# Patient Record
Sex: Female | Born: 1975 | Race: Black or African American | Hispanic: No | Marital: Single | Smoking: Never smoker
Health system: Southern US, Community
[De-identification: ages and names within clinical notes are randomized; demographics above are authoritative.]

## PROBLEM LIST (undated history)

## (undated) ENCOUNTER — Inpatient Hospital Stay (HOSPITAL_COMMUNITY): Payer: Self-pay

## (undated) DIAGNOSIS — F32A Depression, unspecified: Secondary | ICD-10-CM

## (undated) DIAGNOSIS — Z9884 Bariatric surgery status: Secondary | ICD-10-CM

## (undated) DIAGNOSIS — F329 Major depressive disorder, single episode, unspecified: Secondary | ICD-10-CM

## (undated) DIAGNOSIS — Z973 Presence of spectacles and contact lenses: Secondary | ICD-10-CM

## (undated) DIAGNOSIS — K219 Gastro-esophageal reflux disease without esophagitis: Secondary | ICD-10-CM

## (undated) DIAGNOSIS — F419 Anxiety disorder, unspecified: Secondary | ICD-10-CM

## (undated) HISTORY — PX: BUNIONECTOMY: SHX129

## (undated) HISTORY — PX: LAPAROSCOPIC CHOLECYSTECTOMY: SUR755

## (undated) HISTORY — PX: LAPAROSCOPIC GASTRIC SLEEVE RESECTION: SHX5895

---

## 2010-02-06 DIAGNOSIS — Z9884 Bariatric surgery status: Secondary | ICD-10-CM

## 2010-02-06 HISTORY — DX: Bariatric surgery status: Z98.84

## 2010-06-09 ENCOUNTER — Ambulatory Visit: Payer: Self-pay | Admitting: Bariatrics

## 2010-06-17 ENCOUNTER — Ambulatory Visit: Payer: Self-pay | Admitting: Bariatrics

## 2010-07-08 ENCOUNTER — Ambulatory Visit: Payer: Self-pay | Admitting: Bariatrics

## 2010-07-26 ENCOUNTER — Emergency Department: Payer: Self-pay | Admitting: Internal Medicine

## 2013-05-15 ENCOUNTER — Encounter (HOSPITAL_BASED_OUTPATIENT_CLINIC_OR_DEPARTMENT_OTHER): Admission: RE | Payer: Self-pay | Source: Ambulatory Visit

## 2013-05-15 ENCOUNTER — Ambulatory Visit (HOSPITAL_BASED_OUTPATIENT_CLINIC_OR_DEPARTMENT_OTHER): Admission: RE | Admit: 2013-05-15 | Payer: Self-pay | Source: Ambulatory Visit | Admitting: Orthopedic Surgery

## 2013-05-15 SURGERY — REMOVAL, HARDWARE
Anesthesia: Monitor Anesthesia Care | Site: Foot | Laterality: Bilateral

## 2013-07-31 ENCOUNTER — Encounter (HOSPITAL_BASED_OUTPATIENT_CLINIC_OR_DEPARTMENT_OTHER): Payer: Self-pay | Admitting: *Deleted

## 2013-08-06 ENCOUNTER — Other Ambulatory Visit: Payer: Self-pay | Admitting: Orthopedic Surgery

## 2013-08-07 ENCOUNTER — Ambulatory Visit (HOSPITAL_BASED_OUTPATIENT_CLINIC_OR_DEPARTMENT_OTHER)
Admission: RE | Admit: 2013-08-07 | Discharge: 2013-08-07 | Disposition: A | Discharge status: Home / Self Care | Payer: BC Managed Care – PPO | Source: Ambulatory Visit | Attending: Orthopedic Surgery | Admitting: Orthopedic Surgery

## 2013-08-07 ENCOUNTER — Encounter (HOSPITAL_BASED_OUTPATIENT_CLINIC_OR_DEPARTMENT_OTHER)
Admission: RE | Disposition: A | Discharge status: Home / Self Care | Payer: Self-pay | Source: Ambulatory Visit | Attending: Orthopedic Surgery

## 2013-08-07 ENCOUNTER — Encounter (HOSPITAL_BASED_OUTPATIENT_CLINIC_OR_DEPARTMENT_OTHER): Payer: Self-pay | Admitting: *Deleted

## 2013-08-07 ENCOUNTER — Encounter (HOSPITAL_BASED_OUTPATIENT_CLINIC_OR_DEPARTMENT_OTHER): Payer: BC Managed Care – PPO | Admitting: Anesthesiology

## 2013-08-07 ENCOUNTER — Ambulatory Visit (HOSPITAL_BASED_OUTPATIENT_CLINIC_OR_DEPARTMENT_OTHER): Payer: BC Managed Care – PPO | Admitting: Anesthesiology

## 2013-08-07 DIAGNOSIS — M79609 Pain in unspecified limb: Secondary | ICD-10-CM | POA: Insufficient documentation

## 2013-08-07 DIAGNOSIS — T8484XD Pain due to internal orthopedic prosthetic devices, implants and grafts, subsequent encounter: Secondary | ICD-10-CM

## 2013-08-07 DIAGNOSIS — T8489XA Other specified complication of internal orthopedic prosthetic devices, implants and grafts, initial encounter: Secondary | ICD-10-CM | POA: Insufficient documentation

## 2013-08-07 DIAGNOSIS — Y838 Other surgical procedures as the cause of abnormal reaction of the patient, or of later complication, without mention of misadventure at the time of the procedure: Secondary | ICD-10-CM | POA: Insufficient documentation

## 2013-08-07 DIAGNOSIS — F411 Generalized anxiety disorder: Secondary | ICD-10-CM | POA: Insufficient documentation

## 2013-08-07 DIAGNOSIS — Z79899 Other long term (current) drug therapy: Secondary | ICD-10-CM | POA: Insufficient documentation

## 2013-08-07 DIAGNOSIS — K219 Gastro-esophageal reflux disease without esophagitis: Secondary | ICD-10-CM | POA: Insufficient documentation

## 2013-08-07 HISTORY — PX: HARDWARE REMOVAL: SHX979

## 2013-08-07 HISTORY — DX: Anxiety disorder, unspecified: F41.9

## 2013-08-07 HISTORY — DX: Gastro-esophageal reflux disease without esophagitis: K21.9

## 2013-08-07 LAB — POCT HEMOGLOBIN-HEMACUE: Hemoglobin: 11.6 g/dL — ABNORMAL LOW (ref 12.0–15.0)

## 2013-08-07 SURGERY — REMOVAL, HARDWARE
Anesthesia: General | Site: Foot | Laterality: Bilateral

## 2013-08-07 MED ORDER — MIDAZOLAM HCL 2 MG/2ML IJ SOLN
INTRAMUSCULAR | Status: AC
  Filled 2013-08-07: qty 2

## 2013-08-07 MED ORDER — OXYCODONE HCL 5 MG PO TABS: 5.0000 mg | ORAL_TABLET | Freq: Once | ORAL | Status: DC | PRN

## 2013-08-07 MED ORDER — 0.9 % SODIUM CHLORIDE (POUR BTL) OPTIME
TOPICAL | Status: DC | PRN
  Administered 2013-08-07: 250 mL

## 2013-08-07 MED ORDER — FENTANYL CITRATE 0.05 MG/ML IJ SOLN
INTRAMUSCULAR | Status: AC
  Filled 2013-08-07: qty 4

## 2013-08-07 MED ORDER — ACETAMINOPHEN 500 MG PO TABS
1000.0000 mg | ORAL_TABLET | Freq: Once | ORAL | Status: AC
  Administered 2013-08-07: 1000 mg via ORAL

## 2013-08-07 MED ORDER — BUPIVACAINE-EPINEPHRINE (PF) 0.5% -1:200000 IJ SOLN
INTRAMUSCULAR | Status: AC
  Filled 2013-08-07: qty 30

## 2013-08-07 MED ORDER — EPHEDRINE SULFATE 50 MG/ML IJ SOLN
INTRAMUSCULAR | Status: DC | PRN
  Administered 2013-08-07: 10 mg via INTRAVENOUS

## 2013-08-07 MED ORDER — CHLORHEXIDINE GLUCONATE 4 % EX LIQD: 60.0000 mL | Freq: Once | CUTANEOUS | Status: DC

## 2013-08-07 MED ORDER — SODIUM CHLORIDE 0.9 % IV SOLN: INTRAVENOUS | Status: DC

## 2013-08-07 MED ORDER — CEFAZOLIN SODIUM-DEXTROSE 2-3 GM-% IV SOLR
2.0000 g | INTRAVENOUS | Status: AC
  Administered 2013-08-07: 2 g via INTRAVENOUS

## 2013-08-07 MED ORDER — BACITRACIN ZINC 500 UNIT/GM EX OINT
TOPICAL_OINTMENT | CUTANEOUS | Status: AC
  Filled 2013-08-07: qty 28.35

## 2013-08-07 MED ORDER — MIDAZOLAM HCL 5 MG/5ML IJ SOLN
INTRAMUSCULAR | Status: DC | PRN
  Administered 2013-08-07: 2 mg via INTRAVENOUS

## 2013-08-07 MED ORDER — FENTANYL CITRATE 0.05 MG/ML IJ SOLN: 50.0000 ug | INTRAMUSCULAR | Status: DC | PRN

## 2013-08-07 MED ORDER — BUPIVACAINE-EPINEPHRINE 0.5% -1:200000 IJ SOLN
INTRAMUSCULAR | Status: DC | PRN
  Administered 2013-08-07: 10 mL

## 2013-08-07 MED ORDER — DEXAMETHASONE SODIUM PHOSPHATE 4 MG/ML IJ SOLN
INTRAMUSCULAR | Status: DC | PRN
  Administered 2013-08-07: 10 mg via INTRAVENOUS

## 2013-08-07 MED ORDER — CEFAZOLIN SODIUM-DEXTROSE 2-3 GM-% IV SOLR
INTRAVENOUS | Status: AC
  Filled 2013-08-07: qty 50

## 2013-08-07 MED ORDER — PROPOFOL 10 MG/ML IV BOLUS
INTRAVENOUS | Status: DC | PRN
  Administered 2013-08-07: 300 mg via INTRAVENOUS

## 2013-08-07 MED ORDER — ONDANSETRON HCL 4 MG/2ML IJ SOLN: 4.0000 mg | Freq: Once | INTRAMUSCULAR | Status: DC | PRN

## 2013-08-07 MED ORDER — ONDANSETRON HCL 4 MG/2ML IJ SOLN
INTRAMUSCULAR | Status: DC | PRN
  Administered 2013-08-07: 4 mg via INTRAVENOUS

## 2013-08-07 MED ORDER — MIDAZOLAM HCL 2 MG/2ML IJ SOLN: 1.0000 mg | INTRAMUSCULAR | Status: DC | PRN

## 2013-08-07 MED ORDER — FENTANYL CITRATE 0.05 MG/ML IJ SOLN
INTRAMUSCULAR | Status: DC | PRN
  Administered 2013-08-07: 100 ug via INTRAVENOUS

## 2013-08-07 MED ORDER — OXYCODONE HCL 5 MG/5ML PO SOLN: 5.0000 mg | Freq: Once | ORAL | Status: DC | PRN

## 2013-08-07 MED ORDER — LIDOCAINE HCL (CARDIAC) 20 MG/ML IV SOLN
INTRAVENOUS | Status: DC | PRN
  Administered 2013-08-07: 75 mg via INTRAVENOUS

## 2013-08-07 MED ORDER — BUPIVACAINE HCL (PF) 0.25 % IJ SOLN
INTRAMUSCULAR | Status: AC
  Filled 2013-08-07: qty 30

## 2013-08-07 MED ORDER — ACETAMINOPHEN 500 MG PO TABS
ORAL_TABLET | ORAL | Status: AC
  Filled 2013-08-07: qty 2

## 2013-08-07 MED ORDER — BUPIVACAINE-EPINEPHRINE (PF) 0.25% -1:200000 IJ SOLN
INTRAMUSCULAR | Status: AC
  Filled 2013-08-07: qty 30

## 2013-08-07 MED ORDER — HYDROMORPHONE HCL PF 1 MG/ML IJ SOLN
INTRAMUSCULAR | Status: AC
  Filled 2013-08-07: qty 1

## 2013-08-07 MED ORDER — TRAMADOL HCL 50 MG PO TABS
50.0000 mg | ORAL_TABLET | Freq: Four times a day (QID) | ORAL | Status: DC | PRN
Start: 2013-08-07 — End: 2014-02-27

## 2013-08-07 MED ORDER — LACTATED RINGERS IV SOLN
INTRAVENOUS | Status: DC
  Administered 2013-08-07 (×2): via INTRAVENOUS

## 2013-08-07 MED ORDER — HYDROMORPHONE HCL PF 1 MG/ML IJ SOLN
0.2500 mg | INTRAMUSCULAR | Status: DC | PRN
  Administered 2013-08-07: 0.5 mg via INTRAVENOUS

## 2013-08-07 SURGICAL SUPPLY — 71 items
BAG DECANTER FOR FLEXI CONT (MISCELLANEOUS) IMPLANT
BANDAGE ELASTIC 4 VELCRO ST LF (GAUZE/BANDAGES/DRESSINGS) ×6 IMPLANT
BANDAGE ESMARK 6X9 LF (GAUZE/BANDAGES/DRESSINGS) IMPLANT
BLADE SURG 15 STRL LF DISP TIS (BLADE) ×2 IMPLANT
BLADE SURG 15 STRL SS (BLADE) ×4
BNDG COHESIVE 4X5 TAN STRL (GAUZE/BANDAGES/DRESSINGS) IMPLANT
BNDG COHESIVE 6X5 TAN STRL LF (GAUZE/BANDAGES/DRESSINGS) IMPLANT
BNDG ESMARK 4X9 LF (GAUZE/BANDAGES/DRESSINGS) ×3 IMPLANT
BNDG ESMARK 6X9 LF (GAUZE/BANDAGES/DRESSINGS)
CHLORAPREP W/TINT 26ML (MISCELLANEOUS) ×6 IMPLANT
CLOSURE WOUND 1/2 X4 (GAUZE/BANDAGES/DRESSINGS) ×1
COVER TABLE BACK 60X90 (DRAPES) ×3 IMPLANT
CUFF TOURNIQUET SINGLE 34IN LL (TOURNIQUET CUFF) IMPLANT
DECANTER SPIKE VIAL GLASS SM (MISCELLANEOUS) IMPLANT
DRAPE EXTREMITY BILATERAL (DRAPE) ×3 IMPLANT
DRAPE EXTREMITY T 121X128X90 (DRAPE) IMPLANT
DRAPE OEC MINIVIEW 54X84 (DRAPES) ×3 IMPLANT
DRAPE SURG 17X23 STRL (DRAPES) ×6 IMPLANT
DRAPE U-SHAPE 47X51 STRL (DRAPES) IMPLANT
DRSG EMULSION OIL 3X3 NADH (GAUZE/BANDAGES/DRESSINGS) ×3 IMPLANT
DRSG PAD ABDOMINAL 8X10 ST (GAUZE/BANDAGES/DRESSINGS) ×6 IMPLANT
DRSG TEGADERM 4X4.75 (GAUZE/BANDAGES/DRESSINGS) ×6 IMPLANT
ELECT REM PT RETURN 9FT ADLT (ELECTROSURGICAL) ×3
ELECTRODE REM PT RTRN 9FT ADLT (ELECTROSURGICAL) ×1 IMPLANT
GAUZE SPONGE 4X4 12PLY STRL (GAUZE/BANDAGES/DRESSINGS) ×3 IMPLANT
GLOVE BIO SURGEON STRL SZ7 (GLOVE) ×3 IMPLANT
GLOVE BIO SURGEON STRL SZ7.5 (GLOVE) ×3 IMPLANT
GLOVE BIO SURGEON STRL SZ8 (GLOVE) ×3 IMPLANT
GLOVE BIOGEL PI IND STRL 7.0 (GLOVE) ×1 IMPLANT
GLOVE BIOGEL PI IND STRL 7.5 (GLOVE) ×1 IMPLANT
GLOVE BIOGEL PI IND STRL 8 (GLOVE) ×1 IMPLANT
GLOVE BIOGEL PI INDICATOR 7.0 (GLOVE) ×2
GLOVE BIOGEL PI INDICATOR 7.5 (GLOVE) ×2
GLOVE BIOGEL PI INDICATOR 8 (GLOVE) ×2
GLOVE EXAM NITRILE MD LF STRL (GLOVE) ×3 IMPLANT
GOWN STRL REUS W/ TWL LRG LVL3 (GOWN DISPOSABLE) ×2 IMPLANT
GOWN STRL REUS W/ TWL XL LVL3 (GOWN DISPOSABLE) ×1 IMPLANT
GOWN STRL REUS W/TWL LRG LVL3 (GOWN DISPOSABLE) ×4
GOWN STRL REUS W/TWL XL LVL3 (GOWN DISPOSABLE) ×2
NEEDLE HYPO 22GX1.5 SAFETY (NEEDLE) ×3 IMPLANT
PACK BASIN DAY SURGERY FS (CUSTOM PROCEDURE TRAY) ×3 IMPLANT
PAD CAST 4YDX4 CTTN HI CHSV (CAST SUPPLIES) ×2 IMPLANT
PADDING CAST ABS 4INX4YD NS (CAST SUPPLIES)
PADDING CAST ABS COTTON 4X4 ST (CAST SUPPLIES) IMPLANT
PADDING CAST COTTON 4X4 STRL (CAST SUPPLIES) ×4
PADDING CAST COTTON 6X4 STRL (CAST SUPPLIES) IMPLANT
PENCIL BUTTON HOLSTER BLD 10FT (ELECTRODE) IMPLANT
SANITIZER HAND PURELL 535ML FO (MISCELLANEOUS) ×3 IMPLANT
SHEET MEDIUM DRAPE 40X70 STRL (DRAPES) ×3 IMPLANT
SLEEVE SCD COMPRESS KNEE MED (MISCELLANEOUS) IMPLANT
SPLINT FAST PLASTER 5X30 (CAST SUPPLIES)
SPLINT PLASTER CAST FAST 5X30 (CAST SUPPLIES) IMPLANT
SPONGE LAP 18X18 X RAY DECT (DISPOSABLE) ×3 IMPLANT
STAPLER VISISTAT 35W (STAPLE) IMPLANT
STOCKINETTE 6  STRL (DRAPES) ×2
STOCKINETTE 6 STRL (DRAPES) ×1 IMPLANT
STRIP CLOSURE SKIN 1/2X4 (GAUZE/BANDAGES/DRESSINGS) ×2 IMPLANT
SUCTION FRAZIER TIP 10 FR DISP (SUCTIONS) IMPLANT
SUT ETHILON 3 0 PS 1 (SUTURE) IMPLANT
SUT MNCRL AB 3-0 PS2 18 (SUTURE) ×3 IMPLANT
SUT VIC AB 0 SH 27 (SUTURE) IMPLANT
SUT VIC AB 2-0 SH 27 (SUTURE)
SUT VIC AB 2-0 SH 27XBRD (SUTURE) IMPLANT
SUT VICRYL 4-0 PS2 18IN ABS (SUTURE) IMPLANT
SYR BULB 3OZ (MISCELLANEOUS) ×3 IMPLANT
SYRINGE CONTROL L 12CC (SYRINGE) ×3 IMPLANT
TOWEL OR 17X24 6PK STRL BLUE (TOWEL DISPOSABLE) ×6 IMPLANT
TOWEL OR NON WOVEN STRL DISP B (DISPOSABLE) ×3 IMPLANT
TUBE CONNECTING 20'X1/4 (TUBING)
TUBE CONNECTING 20X1/4 (TUBING) IMPLANT
UNDERPAD 30X30 INCONTINENT (UNDERPADS AND DIAPERS) ×3 IMPLANT

## 2013-08-07 NOTE — Brief Op Note (Signed)
08/07/2013  9:34 AM  PATIENT:  [name]  38 y.o. female  PRE-OPERATIVE DIAGNOSIS:  Bilateral painful hardware in feet  POST-OPERATIVE DIAGNOSIS:  Bilateral painful hardware in feet  Procedure(s): BILATERAL FOREFOOT REMOVAL OF DEEP IMPLANTS   SURGEON:  Toni ArthursJohn Marleni Gallardo, MD  ASSISTANT: n/a  ANESTHESIA:   General  EBL:  minimal   TOURNIQUET:   Total Tourniquet Time Documented: Leg (Left) - 9 minutes Total: Leg (Left) - 9 minutes  Leg (Right) - 19 minutes Total: Leg (Right) - 19 minutes   COMPLICATIONS:  None apparent  DISPOSITION:  Extubated, awake and stable to recovery.  DICTATION ID:  [id number]

## 2013-08-07 NOTE — H&P (Signed)
[name] is an 38 y.o. female.   Chief Complaint: bilat foot pain HPI: 38 y/o female without significant pmh c/o bilat foot pain due to hardware from previous bunion corrections.  Past Medical History  Diagnosis Date  . Anxiety   . GERD (gastroesophageal reflux disease)     Past Surgical History  Procedure Laterality Date  . Bunionectomy bilateral    . Vertical sleeve gastrectomy    . Cesarean section      History reviewed. No pertinent family history. Social History:  reports that she has never smoked. She does not have any smokeless tobacco history on file. She reports that she drinks alcohol. She reports that she uses illicit drugs (Marijuana).  Allergies:  Allergies  Allergen Reactions  . Other     Shellfish    Medications Prior to Admission  Medication Sig Dispense Refill  . cetirizine (ZYRTEC) 10 MG tablet Take 10 mg by mouth daily.      Marland Kitchen. esomeprazole (NEXIUM) 40 MG capsule Take 40 mg by mouth 2 (two) times daily.      Marland Kitchen. FLUoxetine (PROZAC) 10 MG tablet Take 10 mg by mouth daily.      . naproxen (NAPROSYN) 500 MG tablet Take 500 mg by mouth 2 (two) times daily with a meal.      . norgestimate-ethinyl estradiol (ORTHO-CYCLEN,SPRINTEC,PREVIFEM) 0.25-35 MG-MCG tablet Take 1 tablet by mouth daily.        No results found for this or any previous visit (from the past 48 hour(s)). No results found.  ROS  No recent f/c/n/v/wt loss  Blood pressure 121/78, pulse 59, temperature 98.7 F (37.1 C), temperature source Oral, resp. rate 18, height 5\' 8"  (1.727 m), weight 86.183 kg (190 lb), last menstrual period 08/06/2013, SpO2 100.00%. Physical Exam  wn wd woman in nad.  A and o x 4.  Mood and affec tnormal.  EOmi.  Resp unlabored.  Bilat feet with healed incisions.  No signs of infection.  No lymphadenopathy.  TTP dorsally at 1st MT necks.  Sens to LT is normal.  5/5 strength in PF and DF of the toes.  Assessment/Plan Painful hardware bilat feet - to OR for  removal of deep implants from both feet.  The risks and benefits of the alternative treatment options have been discussed in detail.  The patient wishes to proceed with surgery and specifically understands risks of bleeding, infection, nerve damage, blood clots, need for additional surgery, amputation and death.   [name], [name] 08/07/2013, 8:13 AM

## 2013-08-07 NOTE — Transfer of Care (Signed)
Immediate Anesthesia Transfer of Care Note  Patient: [name]  Procedure(s) Performed: Procedure(s): BILATERAL FOREFOOT REMOVAL OF DEEP IMPLANTS  (Bilateral)  Patient Location: PACU  Anesthesia Type:General  Level of Consciousness: awake and patient cooperative  Airway & Oxygen Therapy: Patient Spontanous Breathing and Patient connected to face mask oxygen  Post-op Assessment: Report given to PACU RN and Post -op Vital signs reviewed and stable  Post vital signs: Reviewed and stable  Complications: No apparent anesthesia complications

## 2013-08-07 NOTE — Anesthesia Postprocedure Evaluation (Signed)
  Anesthesia Post-op Note  Patient: Yvette ChewJamiequay F Konkle  Procedure(s) Performed: Procedure(s): BILATERAL FOREFOOT REMOVAL OF DEEP IMPLANTS  (Bilateral)  Patient Location: PACU  Anesthesia Type:General  Level of Consciousness: awake, alert and oriented  Airway and Oxygen Therapy: spontaneous breathing  Post-op Pain: mild  Post-op Assessment: Post-op Vital signs reviewed  Post-op Vital Signs: Reviewed  Last Vitals:  Filed Vitals:   08/07/13 1015  BP: 121/78  Pulse: 73  Temp:   Resp: 16    Complications: No apparent anesthesia complications

## 2013-08-07 NOTE — Anesthesia Procedure Notes (Signed)
Procedure Name: LMA Insertion Date/Time: 08/07/2013 8:37 AM Performed by: Zenia ResidesPAYNE, Aston Lieske D Pre-anesthesia Checklist: Patient identified, Emergency Drugs available, Suction available and Patient being monitored Patient Re-evaluated:Patient Re-evaluated prior to inductionOxygen Delivery Method: Circle System Utilized Preoxygenation: Pre-oxygenation with 100% oxygen Intubation Type: IV induction Ventilation: Mask ventilation without difficulty LMA: LMA inserted LMA Size: 4.0 Number of attempts: 1 Airway Equipment and Method: bite block Placement Confirmation: positive ETCO2 Tube secured with: Tape Dental Injury: Teeth and Oropharynx as per pre-operative assessment

## 2013-08-07 NOTE — Anesthesia Preprocedure Evaluation (Signed)
Anesthesia Evaluation  Patient identified by MRN, date of birth, ID band Patient awake    Reviewed: Allergy & Precautions, H&P , NPO status , Patient's Chart, lab work & pertinent test results  Airway Mallampati: I  TM Distance: >3 FB Neck ROM: Full    Dental  (+) Teeth Intact, Dental Advisory Given   Pulmonary  breath sounds clear to auscultation        Cardiovascular Rhythm:Regular Rate:Normal     Neuro/Psych    GI/Hepatic GERD-  Medicated and Controlled,  Endo/Other    Renal/GU      Musculoskeletal   Abdominal   Peds  Hematology   Anesthesia Other Findings   Reproductive/Obstetrics                             Anesthesia Physical Anesthesia Plan  ASA: II  Anesthesia Plan: General   Post-op Pain Management:    Induction: Intravenous  Airway Management Planned: LMA  Additional Equipment:   Intra-op Plan:   Post-operative Plan: Extubation in OR  Informed Consent: I have reviewed the patients History and Physical, chart, labs and discussed the procedure including the risks, benefits and alternatives for the proposed anesthesia with the patient or authorized representative who has indicated his/her understanding and acceptance.   Dental advisory given  Plan Discussed with: CRNA, Anesthesiologist and Surgeon  Anesthesia Plan Comments:         Anesthesia Quick Evaluation  

## 2013-08-07 NOTE — Discharge Instructions (Signed)
Toni ArthursJohn Hewitt, MD [facility]  Please read the following information regarding your care after surgery.  Medications  You only need a prescription for the narcotic pain medicine (ex. oxycodone, Percocet, Norco).  All of the other medicines listed below are available over the counter. X acetominophen (Tylenol) 650 mg every 4-6 hours as you need for minor pain X tramadol as prescribed for moderate to severe pain   Narcotic pain medicine (ex. oxycodone, Percocet, Vicodin) will cause constipation.  To prevent this problem, take the following medicines while you are taking any pain medicine. X docusate sodium (Colace) 100 mg twice a day X senna (Senokot) 2 tablets twice a day  Weight Bearing X Bear weight when you are able on your operated leg or foot.  Cast / Splint / Dressing X Remove your dressing 3 days after surgery and cover the incisions with dry dressings.    After your dressing, cast or splint is removed; you may shower, but do not soak or scrub the wound.  Allow the water to run over it, and then gently pat it dry.  Swelling It is normal for you to have swelling where you had surgery.  To reduce swelling and pain, keep your toes above your nose for at least 3 days after surgery.  It may be necessary to keep your foot or leg elevated for several weeks.  If it hurts, it should be elevated.  Follow Up Call my office at [phone] when you are discharged from the hospital or surgery center to schedule an appointment to be seen two weeks after surgery.  If your incisions look OK and you have no concerns about the surgery, you can cancel your follow up appointment.  Call my office at [phone] if you develop a fever >101.5 F, nausea, vomiting, bleeding from the surgical site or severe pain.     Post Anesthesia Home Care Instructions  Activity: Get plenty of rest for the remainder of the day. A responsible adult should stay with you for 24 hours following the procedure.   For the next 24 hours, DO NOT: -Drive a car -Advertising copywriterperate machinery -Drink alcoholic beverages -Take any medication unless instructed by your physician -Make any legal decisions or sign important papers.  Meals: Start with liquid foods such as gelatin or soup. Progress to regular foods as tolerated. Avoid greasy, spicy, heavy foods. If nausea and/or vomiting occur, drink only clear liquids until the nausea and/or vomiting subsides. Call your physician if vomiting continues.  Special Instructions/Symptoms: Your throat may feel dry or sore from the anesthesia or the breathing tube placed in your throat during surgery. If this causes discomfort, gargle with warm salt water. The discomfort should disappear within 24 hours.

## 2013-08-07 NOTE — Op Note (Signed)
NAMEdwena Felty:  [place],                    ACCOUNT NO.:  [id number]  MEDICAL RECORD NO.:  [id number]  LOCATION:                                 FACILITY:  PHYSICIAN:  Toni ArthursJohn Chanci Ojala, MD             DATE OF BIRTH:  DATE OF PROCEDURE:  08/07/2013 DATE OF DISCHARGE:                              OPERATIVE REPORT   PREOPERATIVE DIAGNOSIS:  Painful hardware, bilateral feet.  POSTOPERATIVE DIAGNOSIS:  Painful hardware, bilateral feet.  PROCEDURE:  Removal of deep implants from the forefoot bilaterally.  SURGEON:  Toni ArthursJohn Yates Weisgerber, MD.  ANESTHESIA:  General.  ESTIMATED BLOOD LOSS:  Minimal.  TOURNIQUET TIME:  On the right was 19 minutes with an ankle Esmarch, on the left it was 9 minutes with an ankle Esmarch.  COMPLICATIONS:  None apparent.  DISPOSITION:  Extubated, awake, and stable to recovery.  INDICATIONS FOR PROCEDURE:  The patient is a [age] year old woman, who underwent bilateral bunion correction in the remote past.  She complains of pain where the screw heads are prominent at the dorsal aspect of the first metatarsal neck.  She has failed nonoperative treatment to date and presents now for elective removal of deep implants.  She understands the risks and benefits, the alternative treatment options, and elects surgical treatment.  She specifically understands risks of bleeding, infection, nerve damage, blood clots, need for additional surgery, continued pain, amputation, and death.  PROCEDURE IN DETAIL:  After preoperative consent was obtained and the correct operative sites were identified, the patient was brought to the operating room and placed supine on the operating table.  General anesthesia was induced.  Preoperative antibiotics were administered. Surgical time-out was taken.  Both lower extremities were prepped and draped in standard sterile fashion.  The right foot was exsanguinated and a 4-inch Esmarch tourniquet was wrapped around the ankle.  The patient's screw head was  palpated.  An incision was made over this location.  Sharp dissection was carried down through the skin and subcutaneous tissue.  Screw head was identified.  It was partially overgrown with bone.  This was removed with a curette.  Screwdriver was then used to back up the screw until the screw head stripped.  At this point, the trephine was used to remove the remaining bone from around the screw head and easy-out was then inserted into the screw head.  This removed the screw in its entirety without further difficulty.  Wound was irrigated copiously.  Inverted simple sutures of 3-0 Monocryl were used to close the skin with a subcuticular level.  Steri-Strips were applied followed by sterile dressings and a compression wrap.  Prior to applying the dressings, 6 mL of 0.5% Marcaine with epinephrine were infiltrated into the subcutaneous tissues about the operative site for postoperative pain control.  The tourniquet was released at 19 minutes after application of the dressings.  Attention was then turned to the left lower extremity.  The left foot was exsanguinated and a 4-inch Esmarch tourniquet was wrapped around the ankle.  The patient's screw head was palpated and the incision was made over the screw head.  The screw head was cleared  of all soft tissues with the curette.  Again, significant bony overgrowth was noted.  A trephine was used to remove this bone clearing the head of the screw completely.  The screwdriver was then used to remove the screw in its entirety.  Wound was irrigated copiously and closed with Monocryl and Steri-Strips.  4 mL of 0.5% bupivacaine with epinephrine was infiltrated into the subcutaneous tissues around the operative site.  Sterile dressings were applied followed by compression wrap.  Tourniquet was released at 9 minutes after application of the dressings.  The patient was awakened from anesthesia and transported to the recovery room in stable  condition.  FOLLOWUP PLAN:  The patient will be weightbearing as tolerated on both lower extremities.  She will follow up with me in approximately 2 weeks if she has any concerns about her wounds.  She will gradually resume her regular activities, but avoid jumping or running for a month.     Toni ArthursJohn Gabriele Zwilling, MD     JH/MEDQ  D:  08/07/2013  T:  08/07/2013  Job:  [id number]

## 2013-08-11 ENCOUNTER — Encounter (HOSPITAL_BASED_OUTPATIENT_CLINIC_OR_DEPARTMENT_OTHER): Payer: Self-pay | Admitting: Orthopedic Surgery

## 2014-02-27 ENCOUNTER — Encounter (HOSPITAL_COMMUNITY): Payer: Self-pay | Admitting: *Deleted

## 2014-02-27 ENCOUNTER — Inpatient Hospital Stay (HOSPITAL_COMMUNITY): Payer: BC Managed Care – PPO

## 2014-02-27 ENCOUNTER — Inpatient Hospital Stay (HOSPITAL_COMMUNITY)
Admission: AD | Admit: 2014-02-27 | Discharge: 2014-02-27 | Disposition: A | Discharge status: Home / Self Care | Payer: BC Managed Care – PPO | Source: Ambulatory Visit | Attending: Obstetrics and Gynecology | Admitting: Obstetrics and Gynecology

## 2014-02-27 DIAGNOSIS — Z3A01 Less than 8 weeks gestation of pregnancy: Secondary | ICD-10-CM | POA: Insufficient documentation

## 2014-02-27 DIAGNOSIS — O360910 Maternal care for other rhesus isoimmunization, first trimester, not applicable or unspecified: Secondary | ICD-10-CM | POA: Insufficient documentation

## 2014-02-27 DIAGNOSIS — O209 Hemorrhage in early pregnancy, unspecified: Secondary | ICD-10-CM | POA: Diagnosis present

## 2014-02-27 DIAGNOSIS — O200 Threatened abortion: Secondary | ICD-10-CM | POA: Diagnosis not present

## 2014-02-27 LAB — URINALYSIS, ROUTINE W REFLEX MICROSCOPIC
Bilirubin Urine: NEGATIVE
GLUCOSE, UA: NEGATIVE mg/dL
Ketones, ur: NEGATIVE mg/dL
Leukocytes, UA: NEGATIVE
NITRITE: NEGATIVE
Protein, ur: NEGATIVE mg/dL
Specific Gravity, Urine: 1.015 (ref 1.005–1.030)
Urobilinogen, UA: 0.2 mg/dL (ref 0.0–1.0)
pH: 6.5 (ref 5.0–8.0)

## 2014-02-27 LAB — POCT PREGNANCY, URINE: Preg Test, Ur: POSITIVE — AB

## 2014-02-27 LAB — URINE MICROSCOPIC-ADD ON

## 2014-02-27 MED ORDER — TRAMADOL HCL 50 MG PO TABS: 50.0000 mg | ORAL_TABLET | Freq: Four times a day (QID) | ORAL | Status: DC | PRN

## 2014-02-27 NOTE — MAU Note (Signed)
Had U/S in office 4 days ago and confirmed IUP with heartbeat. Patient states she had some brown D/C and was seen in the office 3 days ago. Was given Rhophylac because she is Rh neg. States today around 2PM, she started having red bleeding. Upon arrival, was having small amount of bright red bleeding in toilet. C/O mild cramping in lower abdomen. Took Tylenol, states that it relieved cramping.

## 2014-02-27 NOTE — MAU Provider Note (Signed)
History     CSN: [id number]  Arrival date and time: 02/27/14 1616   First Provider Initiated Contact with Patient 02/27/14 1657      Chief Complaint  Patient presents with  . Vaginal Bleeding   HPI [name] 39 y.o. G2P1001 @ [redacted]w[redacted]d presents to MAU complaining of vaginal bleeding and pressure in the lower abdomen.  The pressure is midline and has improved since the use acetaminophen 2-3 tsp liquid.  The bleeding is bright red, like a light day during her period, and started around 2 pm.  She denies headache, weakness, dysuria, nausea, vomiting.  She does have some fatigue - just doesn't feel like doing anything.   OB History    Gravida Para Term Preterm AB TAB SAB Ectopic Multiple Living   Past Medical History  Diagnosis Date  . Anxiety   . GERD (gastroesophageal reflux disease)     Past Surgical History  Procedure Laterality Date  . Bunionectomy bilateral    . Vertical sleeve gastrectomy    . Cesarean section    . Hardware removal Bilateral 08/07/2013    Procedure: BILATERAL FOREFOOT REMOVAL OF DEEP IMPLANTS ;  Surgeon: Toni Arthurs, MD;  Location: [facility];  Service: Orthopedics;  Laterality: Bilateral;  . Cholecystectomy      History reviewed. No pertinent family history.  History  Substance Use Topics  . Smoking status: Never Smoker   . Smokeless tobacco: Never Used  . Alcohol Use: Yes     Comment: occassionally    Allergies:  Allergies  Allergen Reactions  . Other     Shellfish  . Pumpkin Seed Hives  . Vicodin [Hydrocodone-Acetaminophen] Rash    Prescriptions prior to admission  Medication Sig Dispense Refill Last Dose  . esomeprazole (NEXIUM) 40 MG capsule Take 40 mg by mouth 2 (two) times daily.   08/07/2013 at Unknown time  . traMADol (ULTRAM) 50 MG tablet Take 1 tablet (50 mg total) by mouth every 6 (six) hours as needed for moderate pain. 10 tablet 0     ROS Pertinent ROS in HPI  Physical Exam   Blood  pressure 110/66, pulse 82, temperature 98.3 F (36.8 C), temperature source Oral, resp. rate 18, height  (1.702 m), weight 183 lb (83.008 kg), last menstrual period 01/06/2014.  Physical Exam  Constitutional: She is oriented to person, place, and time. She appears well-developed and well-nourished.  HENT:  Head: Normocephalic and atraumatic.  Eyes: EOM are normal.  Neck: Normal range of motion.  Cardiovascular: Normal rate and regular rhythm.   Respiratory: Effort normal and breath sounds normal. No respiratory distress.  GI: Soft. Bowel sounds are normal. She exhibits no distension. There is tenderness.  Mild suprapubic tenderness  Genitourinary:  Small amt of dark red blood with small clots present in vagina.  No active bleeding.   Slight tenderness over bladder and in left adnexa.  No CMT  Musculoskeletal: Normal range of motion.  Neurological: She is alert and oriented to person, place, and time.  Skin: Skin is warm and dry.  Psychiatric: She has a normal mood and affect.    MAU Course  Procedures  MDM Discussed with Dr. Rana Snare.  He is in agreement to obtain U/S but no further workup necessitated at this time.  He is agreeable to discharge upon results of U/S.  US Ob Comp Less 14 Wks  02/27/2014   CLINICAL  DATA:  Bleeding, pressure and cramping. Clinical gestational age of [redacted] weeks and 3 days.  EXAM: OBSTETRIC <14 WK US AND TRANSVAGINAL OB US  TECHNIQUE: Both transabdominal and transvaginal ultrasound examinations were performed for complete evaluation of the gestation as well as the maternal uterus, adnexal regions, and pelvic cul-de-sac. Transvaginal technique was performed to assess early pregnancy.  COMPARISON:  None.  FINDINGS: Intrauterine gestational sac: Single  Yolk sac:  Yes  Embryo:  Yes  Cardiac Activity: Yes  Heart Rate:  162 bpm  MSD:    mm    w     d  CRL:   1.44  mm   7 w 6 d                  US EDC: 10/10/2014  Maternal uterus/adnexae:  Subchorionic hemorrhage:  Large subchorionic hemorrhage measures 6.8 x 0.5 x 0.5 cm.  Right ovary: Normal  Left ovary: Not visualized.  Other :The gestational sac appears disproportionately large compared with the crown rump length.  Free fluid:  None  IMPRESSION: Single living intrauterine gestation with an estimated gestational age of [redacted] weeks and 6 days.  Large subchorionic hemorrhage.   Electronically Signed   By: Signa Kellaylor  Stroud M.D.   On: 02/27/2014 18:11   [place] Ob Transvaginal  02/27/2014   CLINICAL DATA:  Bleeding, pressure and cramping. Clinical gestational age of [redacted] weeks and 3 days.  EXAM: OBSTETRIC <14 WK US AND TRANSVAGINAL OB US  TECHNIQUE: Both transabdominal and transvaginal ultrasound examinations were performed for complete evaluation of the gestation as well as the maternal uterus, adnexal regions, and pelvic cul-de-sac. Transvaginal technique was performed to assess early pregnancy.  COMPARISON:  None.  FINDINGS: Intrauterine gestational sac: Single  Yolk sac:  Yes  Embryo:  Yes  Cardiac Activity: Yes  Heart Rate:  162 bpm  MSD:    mm    w     d  CRL:   1.44  mm   7 w 6 d                  US EDC: 10/10/2014  Maternal uterus/adnexae:  Subchorionic hemorrhage: Large subchorionic hemorrhage measures 6.8 x 0.5 x 0.5 cm.  Right ovary: Normal  Left ovary: Not visualized.  Other :The gestational sac appears disproportionately large compared with the crown rump length.  Free fluid:  None  IMPRESSION: Single living intrauterine gestation with an estimated gestational age of [redacted] weeks and 6 days.  Large subchorionic hemorrhage.   Electronically Signed   By: Signa Kellaylor  Stroud M.D.   On: 02/27/2014 18:11     Assessment and Plan  A:  1. Threatened abortion   2. Vaginal bleeding in pregnancy, first trimester    P: Discharge to home Miscarriage precautions Pelvic Rest  Ultram PRN pain uncontrolled with OTC Tylenol RTC on Monday for follow up Patient may return to MAU as needed or if her condition were to change or  worsen   Bertram Denvereague Clark, Blessed Cotham E 02/27/2014, 4:59 PM

## 2014-02-27 NOTE — Discharge Instructions (Signed)
Threatened Miscarriage °A threatened miscarriage occurs when you have vaginal bleeding during your first 20 weeks of pregnancy but the pregnancy has not ended. If you have vaginal bleeding during this time, your health care provider will do tests to make sure you are still pregnant. If the tests show you are still pregnant and the developing baby (fetus) inside your womb (uterus) is still growing, your condition is considered a threatened miscarriage. °A threatened miscarriage does not mean your pregnancy will end, but it does increase the risk of losing your pregnancy (complete miscarriage). °CAUSES  °The cause of a threatened miscarriage is usually not known. If you go on to have a complete miscarriage, the most common cause is an abnormal number of chromosomes in the developing baby. Chromosomes are the structures inside cells that hold all your genetic material. °Some causes of vaginal bleeding that do not result in miscarriage include: °· Having sex. °· Having an infection. °· Normal hormone changes of pregnancy. °· Bleeding that occurs when an egg implants in your uterus. °RISK FACTORS °Risk factors for bleeding in early pregnancy include: °· Obesity. °· Smoking. °· Drinking excessive amounts of alcohol or caffeine. °· Recreational drug use. °SIGNS AND SYMPTOMS °· Light vaginal bleeding. °· Mild abdominal pain or cramps. °DIAGNOSIS  °If you have bleeding with or without abdominal pain before 20 weeks of pregnancy, your health care provider will do tests to check whether you are still pregnant. One important test involves using sound waves and a computer (ultrasound) to create images of the inside of your uterus. Other tests include an internal exam of your vagina and uterus (pelvic exam) and measurement of your baby's heart rate.  °You may be diagnosed with a threatened miscarriage if: °· Ultrasound testing shows you are still pregnant. °· Your baby's heart rate is strong. °· A pelvic exam shows that the  opening between your uterus and your vagina (cervix) is closed. °· Your heart rate and blood pressure are stable. °· Blood tests confirm you are still pregnant. °TREATMENT  °No treatments have been shown to prevent a threatened miscarriage from going on to a complete miscarriage. However, the right home care is important.  °HOME CARE INSTRUCTIONS  °· Make sure you keep all your appointments for prenatal care. This is very important. °· Get plenty of rest. °· Do not have sex or use tampons if you have vaginal bleeding. °· Do not douche. °· Do not smoke or use recreational drugs. °· Do not drink alcohol. °· Avoid caffeine. °SEEK MEDICAL CARE IF: °· You have light vaginal bleeding or spotting while pregnant. °· You have abdominal pain or cramping. °· You have a fever. °SEEK IMMEDIATE MEDICAL CARE IF: °· You have heavy vaginal bleeding. °· You have blood clots coming from your vagina. °· You have severe low back pain or abdominal cramps. °· You have fever, chills, and severe abdominal pain. °MAKE SURE YOU: °· Understand these instructions. °· Will watch your condition. °· Will get help right away if you are not doing well or get worse. °Document Released: 01/23/2005 Document Revised: 01/28/2013 Document Reviewed: 11/19/2012 °ExitCare® Patient Information ©2015 [employer]. This information is not intended to replace advice given to you by your health care provider. Make sure you discuss any questions you have with your health care provider. ° °Pelvic Rest °Pelvic rest is sometimes recommended for women when:  °· The placenta is partially or completely covering the opening of the cervix (placenta previa). °· There is bleeding between the   uterine wall and the amniotic sac in the first trimester (subchorionic hemorrhage). °· The cervix begins to open without labor starting (incompetent cervix, cervical insufficiency). °· The labor is too early (preterm labor). °HOME CARE INSTRUCTIONS °· Do not have sexual intercourse,  stimulation, or an orgasm. °· Do not use tampons, douche, or put anything in the vagina. °· Do not lift anything over 10 pounds (4.5 kg). °· Avoid strenuous activity or straining your pelvic muscles. °SEEK MEDICAL CARE IF:  °· You have any vaginal bleeding during pregnancy. Treat this as a potential emergency. °· You have cramping pain felt low in the stomach (stronger than menstrual cramps). °· You notice vaginal discharge (watery, mucus, or bloody). °· You have a low, dull backache. °· There are regular contractions or uterine tightening. °SEEK IMMEDIATE MEDICAL CARE IF: °You have vaginal bleeding and have placenta previa.  °Document Released: 05/20/2010 Document Revised: 04/17/2011 Document Reviewed: 05/20/2010 °ExitCare® Patient Information ©2015 [employer]. This information is not intended to replace advice given to you by your health care provider. Make sure you discuss any questions you have with your health care provider. ° °

## 2014-03-12 LAB — OB RESULTS CONSOLE RUBELLA ANTIBODY, IGM: Rubella: IMMUNE

## 2014-03-12 LAB — OB RESULTS CONSOLE RPR: RPR: NONREACTIVE

## 2014-03-12 LAB — OB RESULTS CONSOLE ANTIBODY SCREEN: Antibody Screen: NEGATIVE

## 2014-03-12 LAB — OB RESULTS CONSOLE HEPATITIS B SURFACE ANTIGEN: HEP B S AG: NEGATIVE

## 2014-03-12 LAB — OB RESULTS CONSOLE HIV ANTIBODY (ROUTINE TESTING): HIV: NONREACTIVE

## 2014-03-12 LAB — OB RESULTS CONSOLE ABO/RH: RH TYPE: NEGATIVE

## 2014-10-05 ENCOUNTER — Encounter (HOSPITAL_COMMUNITY): Payer: Self-pay | Admitting: Anesthesiology

## 2014-10-05 ENCOUNTER — Encounter (INDEPENDENT_AMBULATORY_CARE_PROVIDER_SITE_OTHER): Payer: Self-pay

## 2014-10-05 ENCOUNTER — Encounter (HOSPITAL_COMMUNITY)
Admission: RE | Admit: 2014-10-05 | Discharge: 2014-10-05 | Disposition: A | Discharge status: Home / Self Care | Payer: BC Managed Care – PPO | Source: Ambulatory Visit | Attending: Obstetrics and Gynecology | Admitting: Obstetrics and Gynecology

## 2014-10-05 ENCOUNTER — Encounter (HOSPITAL_COMMUNITY): Payer: Self-pay

## 2014-10-05 HISTORY — DX: Major depressive disorder, single episode, unspecified: F32.9

## 2014-10-05 HISTORY — DX: Depression, unspecified: F32.A

## 2014-10-05 LAB — CBC
HCT: 34.8 % — ABNORMAL LOW (ref 36.0–46.0)
HEMOGLOBIN: 11.6 g/dL — AB (ref 12.0–15.0)
MCH: 28.2 pg (ref 26.0–34.0)
MCHC: 33.3 g/dL (ref 30.0–36.0)
MCV: 84.5 fL (ref 78.0–100.0)
Platelets: 199 10*3/uL (ref 150–400)
RBC: 4.12 MIL/uL (ref 3.87–5.11)
RDW: 17.1 % — ABNORMAL HIGH (ref 11.5–15.5)
WBC: 7.3 10*3/uL (ref 4.0–10.5)

## 2014-10-05 LAB — TYPE AND SCREEN
ABO/RH(D): AB NEG
Antibody Screen: NEGATIVE

## 2014-10-05 LAB — ABO/RH: ABO/RH(D): AB NEG

## 2014-10-05 NOTE — Patient Instructions (Signed)
Your procedure is scheduled on:10/06/14  Enter through the Main Entrance at :6am Pick up desk phone and dial [phone] and inform us of your arrival.  Please call [phone] if you have any problems the morning of surgery.  Remember: Do not eat food or drink liquids after midnight:tonight Water only is ok until:03:30 am Tuesday   You may brush your teeth the morning of surgery.   DO NOT wear jewelry, eye make-up, lipstick,body lotion, or dark fingernail polish.  (Polished toes are ok) You may wear deodorant.  If you are to be admitted after surgery, leave suitcase in car until your room has been assigned. Patients discharged on the day of surgery will not be allowed to drive home. Wear loose fitting, comfortable clothes for your ride home.

## 2014-10-05 NOTE — Anesthesia Preprocedure Evaluation (Addendum)
Anesthesia Evaluation  Patient identified by MRN, date of birth, ID band Patient awake    Reviewed: Allergy & Precautions, NPO status , Patient's Chart, lab work & pertinent test results  Airway Mallampati: II  TM Distance: >3 FB Neck ROM: Full    Dental no notable dental hx. (+) Caps   Pulmonary shortness of breath,  breath sounds clear to auscultation  Pulmonary exam normal       Cardiovascular negative cardio ROS Normal cardiovascular examRhythm:Regular Rate:Normal     Neuro/Psych PSYCHIATRIC DISORDERS Anxiety Depression negative neurological ROS     GI/Hepatic Neg liver ROS, GERD-  Medicated and Controlled,  Endo/Other  Obesity  Renal/GU negative Renal ROS  negative genitourinary   Musculoskeletal negative musculoskeletal ROS (+)   Abdominal   Peds  (+) Delivery details - Hematology negative hematology ROS (+)   Anesthesia Other Findings   Reproductive/Obstetrics (+) Pregnancy Previous C/Section AMA 38 weeks                            Anesthesia Physical Anesthesia Plan  ASA: II  Anesthesia Plan: Spinal   Post-op Pain Management:    Induction:   Airway Management Planned: Natural Airway  Additional Equipment:   Intra-op Plan:   Post-operative Plan:   Informed Consent: I have reviewed the patients History and Physical, chart, labs and discussed the procedure including the risks, benefits and alternatives for the proposed anesthesia with the patient or authorized representative who has indicated his/her understanding and acceptance.   Dental advisory given  Plan Discussed with: CRNA, Anesthesiologist and Surgeon  Anesthesia Plan Comments: (Will not use intrathecal narcotics due to allergy)        Anesthesia Quick Evaluation

## 2014-10-05 NOTE — H&P (Signed)
[name] is a 39 y.o. female presenting at 30 weeks for  Repeat cesarean section.  Prior cesarean section for failure to descend. Declined trial of labor. Maternal Medical History:  Reason for admission: Repeat cesearean section  Fetal activity: Perceived fetal activity is normal.    Prenatal complications: no prenatal complications Prenatal Complications - Diabetes: none.    OB History    Gravida Para Term Preterm AB TAB SAB Ectopic Multiple Living   Past Medical History  Diagnosis Date  . Anxiety   . GERD (gastroesophageal reflux disease)   . Shortness of breath dyspnea 2016    during this pregnancy-cannot lay flat  . Depression    Past Surgical History  Procedure Laterality Date  . Bunionectomy bilateral    . Vertical sleeve gastrectomy    . Cesarean section    . Hardware removal Bilateral 08/07/2013    Procedure: BILATERAL FOREFOOT REMOVAL OF DEEP IMPLANTS ;  Surgeon: Toni Arthurs, MD;  Location: [facility];  Service: Orthopedics;  Laterality: Bilateral;  . Cholecystectomy     Family History: family history is not on file. Social History:  reports that she has never smoked. She has never used smokeless tobacco. She reports that she drinks alcohol. She reports that she uses illicit drugs (Marijuana).   Prenatal Transfer Tool  Maternal Diabetes: No Genetic Screening: Normal Maternal Ultrasounds/Referrals: Normal Fetal Ultrasounds or other Referrals:  None Maternal Substance Abuse:  No Significant Maternal Medications:  None Significant Maternal Lab Results:  Lab values include: Group B Strep positive Other Comments:  None  ROS    Last menstrual period 01/06/2014. Maternal Exam:  Abdomen: Patient reports no abdominal tenderness. Surgical scars: low transverse.   Fetal presentation: vertex     Physical Exam  Constitutional: She is oriented to person, place, and time. She appears well-developed and well-nourished.   HENT:  Head: Normocephalic.  Eyes: Conjunctivae and EOM are normal. Pupils are equal, round, and reactive to light.  Cardiovascular: Normal rate, regular rhythm and normal heart sounds.   Respiratory: Effort normal and breath sounds normal.  GI:  Gravid uterus c/w dates well healed low transverse incision  Musculoskeletal: Normal range of motion.  Neurological: She is alert and oriented to person, place, and time.    Prenatal labs: ABO, Rh: AB/Negative/-- (02/04 0000) Antibody: Negative (02/04 0000) Rubella: Immune (02/04 0000) RPR: Nonreactive (02/04 0000)  HBsAg: Negative (02/04 0000)  HIV: Non-reactive (02/04 0000)  GBS:     Assessment/Plan: IUP AT TERM FOR REPEAT CESAREAN SECTION Risk of cesarean section discussed.  These include:  Risk of infection;  Risk of hemorrhage that could require transfusions with the associated risk of aids or hepatitis;  Excessive bleeding could require hysterectomy;  Risk of injury to adjacent organs including bladder, bowel or ureters;  Risk of DVT's and possible pulmonary embolus.  Patient expresses a understanding of indications and risks.;   [name] S 10/05/2014, 2:32 PM

## 2014-10-06 ENCOUNTER — Inpatient Hospital Stay (HOSPITAL_COMMUNITY): Payer: BC Managed Care – PPO | Admitting: Anesthesiology

## 2014-10-06 ENCOUNTER — Encounter (HOSPITAL_COMMUNITY): Payer: Self-pay | Admitting: Certified Registered Nurse Anesthetist

## 2014-10-06 ENCOUNTER — Inpatient Hospital Stay (HOSPITAL_COMMUNITY)
Admission: RE | Admit: 2014-10-06 | Discharge: 2014-10-09 | DRG: 765 | Disposition: A | Discharge status: Home / Self Care | Payer: BC Managed Care – PPO | Source: Ambulatory Visit | Attending: Obstetrics and Gynecology | Admitting: Obstetrics and Gynecology

## 2014-10-06 ENCOUNTER — Encounter (HOSPITAL_COMMUNITY)
Admission: RE | Disposition: A | Discharge status: Home / Self Care | Payer: Self-pay | Source: Ambulatory Visit | Attending: Obstetrics and Gynecology

## 2014-10-06 DIAGNOSIS — Z3A30 30 weeks gestation of pregnancy: Secondary | ICD-10-CM | POA: Diagnosis present

## 2014-10-06 DIAGNOSIS — F329 Major depressive disorder, single episode, unspecified: Secondary | ICD-10-CM | POA: Diagnosis present

## 2014-10-06 DIAGNOSIS — Z3A38 38 weeks gestation of pregnancy: Secondary | ICD-10-CM | POA: Diagnosis present

## 2014-10-06 DIAGNOSIS — O99214 Obesity complicating childbirth: Secondary | ICD-10-CM | POA: Diagnosis present

## 2014-10-06 DIAGNOSIS — O99824 Streptococcus B carrier state complicating childbirth: Secondary | ICD-10-CM | POA: Diagnosis present

## 2014-10-06 DIAGNOSIS — O99324 Drug use complicating childbirth: Secondary | ICD-10-CM | POA: Diagnosis present

## 2014-10-06 DIAGNOSIS — O09523 Supervision of elderly multigravida, third trimester: Secondary | ICD-10-CM

## 2014-10-06 DIAGNOSIS — O09513 Supervision of elderly primigravida, third trimester: Secondary | ICD-10-CM

## 2014-10-06 DIAGNOSIS — Z98891 History of uterine scar from previous surgery: Secondary | ICD-10-CM

## 2014-10-06 DIAGNOSIS — F419 Anxiety disorder, unspecified: Secondary | ICD-10-CM | POA: Diagnosis present

## 2014-10-06 DIAGNOSIS — K219 Gastro-esophageal reflux disease without esophagitis: Secondary | ICD-10-CM | POA: Diagnosis present

## 2014-10-06 DIAGNOSIS — O9962 Diseases of the digestive system complicating childbirth: Secondary | ICD-10-CM | POA: Diagnosis present

## 2014-10-06 DIAGNOSIS — O99344 Other mental disorders complicating childbirth: Secondary | ICD-10-CM | POA: Diagnosis present

## 2014-10-06 DIAGNOSIS — O3421 Maternal care for scar from previous cesarean delivery: Secondary | ICD-10-CM | POA: Diagnosis present

## 2014-10-06 DIAGNOSIS — E669 Obesity, unspecified: Secondary | ICD-10-CM | POA: Diagnosis present

## 2014-10-06 DIAGNOSIS — R0602 Shortness of breath: Secondary | ICD-10-CM | POA: Diagnosis present

## 2014-10-06 DIAGNOSIS — Z6833 Body mass index (BMI) 33.0-33.9, adult: Secondary | ICD-10-CM

## 2014-10-06 DIAGNOSIS — J029 Acute pharyngitis, unspecified: Secondary | ICD-10-CM | POA: Diagnosis present

## 2014-10-06 LAB — RPR: RPR: NONREACTIVE

## 2014-10-06 SURGERY — Surgical Case
Anesthesia: Spinal | Site: Abdomen

## 2014-10-06 MED ORDER — DIBUCAINE 1 % RE OINT: 1.0000 "application " | TOPICAL_OINTMENT | RECTAL | Status: DC | PRN

## 2014-10-06 MED ORDER — LACTATED RINGERS IV SOLN
INTRAVENOUS | Status: DC
  Administered 2014-10-06: 19:00:00 via INTRAVENOUS

## 2014-10-06 MED ORDER — METOCLOPRAMIDE HCL 5 MG/ML IJ SOLN: 10.0000 mg | Freq: Once | INTRAMUSCULAR | Status: DC | PRN

## 2014-10-06 MED ORDER — CEFAZOLIN SODIUM-DEXTROSE 2-3 GM-% IV SOLR
2.0000 g | INTRAVENOUS | Status: AC
  Administered 2014-10-06: 2 g via INTRAVENOUS

## 2014-10-06 MED ORDER — DIPHENHYDRAMINE HCL 25 MG PO CAPS
25.0000 mg | ORAL_CAPSULE | Freq: Four times a day (QID) | ORAL | Status: DC | PRN
  Administered 2014-10-07 (×2): 25 mg via ORAL
  Filled 2014-10-06 (×2): qty 1

## 2014-10-06 MED ORDER — KETOROLAC TROMETHAMINE 30 MG/ML IJ SOLN
INTRAMUSCULAR | Status: AC
  Filled 2014-10-06: qty 1

## 2014-10-06 MED ORDER — ONDANSETRON HCL 4 MG/2ML IJ SOLN
INTRAMUSCULAR | Status: DC | PRN
  Administered 2014-10-06: 4 mg via INTRAVENOUS

## 2014-10-06 MED ORDER — WITCH HAZEL-GLYCERIN EX PADS: 1.0000 "application " | MEDICATED_PAD | CUTANEOUS | Status: DC | PRN

## 2014-10-06 MED ORDER — BISACODYL 10 MG RE SUPP: 10.0000 mg | Freq: Every day | RECTAL | Status: DC | PRN

## 2014-10-06 MED ORDER — ERYTHROMYCIN 5 MG/GM OP OINT
TOPICAL_OINTMENT | OPHTHALMIC | Status: AC
  Filled 2014-10-06: qty 1

## 2014-10-06 MED ORDER — MEPERIDINE HCL 25 MG/ML IJ SOLN: 6.2500 mg | INTRAMUSCULAR | Status: DC | PRN

## 2014-10-06 MED ORDER — OXYTOCIN 10 UNIT/ML IJ SOLN
40.0000 [IU] | INTRAVENOUS | Status: DC | PRN
  Administered 2014-10-06: 40 [IU] via INTRAVENOUS

## 2014-10-06 MED ORDER — CEFAZOLIN SODIUM-DEXTROSE 2-3 GM-% IV SOLR
INTRAVENOUS | Status: AC
  Filled 2014-10-06: qty 50

## 2014-10-06 MED ORDER — FENTANYL 10 MCG/ML IV SOLN
INTRAVENOUS | Status: DC
  Administered 2014-10-06: 11:00:00 via INTRAVENOUS
  Administered 2014-10-06: 120 ug via INTRAVENOUS
  Administered 2014-10-06: 150 ug via INTRAVENOUS
  Filled 2014-10-06: qty 50

## 2014-10-06 MED ORDER — DIPHENHYDRAMINE HCL 50 MG/ML IJ SOLN
INTRAMUSCULAR | Status: DC | PRN
  Administered 2014-10-06: 25 mg via INTRAVENOUS

## 2014-10-06 MED ORDER — NALOXONE HCL 0.4 MG/ML IJ SOLN: 0.4000 mg | INTRAMUSCULAR | Status: DC | PRN

## 2014-10-06 MED ORDER — KETOROLAC TROMETHAMINE 30 MG/ML IJ SOLN
30.0000 mg | Freq: Once | INTRAMUSCULAR | Status: AC
  Administered 2014-10-06: 30 mg via INTRAMUSCULAR

## 2014-10-06 MED ORDER — BUPIVACAINE IN DEXTROSE 0.75-8.25 % IT SOLN
INTRATHECAL | Status: DC | PRN
  Administered 2014-10-06: 1.8 mL via INTRATHECAL

## 2014-10-06 MED ORDER — MEPERIDINE HCL 25 MG/ML IJ SOLN
INTRAMUSCULAR | Status: DC | PRN
  Administered 2014-10-06 (×2): 12.5 mg via INTRAVENOUS

## 2014-10-06 MED ORDER — SIMETHICONE 80 MG PO CHEW
80.0000 mg | CHEWABLE_TABLET | ORAL | Status: DC
  Administered 2014-10-06 – 2014-10-09 (×3): 80 mg via ORAL
  Filled 2014-10-06 (×3): qty 1

## 2014-10-06 MED ORDER — MIDAZOLAM HCL 2 MG/2ML IJ SOLN
INTRAMUSCULAR | Status: DC | PRN
  Administered 2014-10-06: 2 mg via INTRAVENOUS

## 2014-10-06 MED ORDER — OXYTOCIN 40 UNITS IN LACTATED RINGERS INFUSION - SIMPLE MED: 62.5000 mL/h | INTRAVENOUS | Status: AC

## 2014-10-06 MED ORDER — MORPHINE SULFATE 0.5 MG/ML IJ SOLN
INTRAMUSCULAR | Status: AC
  Filled 2014-10-06: qty 100

## 2014-10-06 MED ORDER — DIPHENHYDRAMINE HCL 50 MG/ML IJ SOLN
INTRAMUSCULAR | Status: AC
  Filled 2014-10-06: qty 1

## 2014-10-06 MED ORDER — SIMETHICONE 80 MG PO CHEW
80.0000 mg | CHEWABLE_TABLET | Freq: Three times a day (TID) | ORAL | Status: DC
  Administered 2014-10-06 – 2014-10-09 (×5): 80 mg via ORAL
  Filled 2014-10-06 (×7): qty 1

## 2014-10-06 MED ORDER — MEPERIDINE HCL 25 MG/ML IJ SOLN
INTRAMUSCULAR | Status: AC
  Filled 2014-10-06: qty 1

## 2014-10-06 MED ORDER — DIPHENHYDRAMINE HCL 50 MG/ML IJ SOLN: 12.5000 mg | Freq: Four times a day (QID) | INTRAMUSCULAR | Status: DC | PRN

## 2014-10-06 MED ORDER — ONDANSETRON HCL 4 MG/2ML IJ SOLN
INTRAMUSCULAR | Status: AC
Start: 2014-10-06 — End: 2014-10-06
  Filled 2014-10-06: qty 2

## 2014-10-06 MED ORDER — TETANUS-DIPHTH-ACELL PERTUSSIS 5-2.5-18.5 LF-MCG/0.5 IM SUSP: 0.5000 mL | Freq: Once | INTRAMUSCULAR | Status: DC

## 2014-10-06 MED ORDER — SODIUM CHLORIDE 0.9 % IV SOLN: 250.0000 mL | INTRAVENOUS | Status: DC

## 2014-10-06 MED ORDER — DEXAMETHASONE SODIUM PHOSPHATE 4 MG/ML IJ SOLN
INTRAMUSCULAR | Status: DC | PRN
Start: 2014-10-06 — End: 2014-10-06
  Administered 2014-10-06: 4 mg via INTRAVENOUS

## 2014-10-06 MED ORDER — SCOPOLAMINE 1 MG/3DAYS TD PT72
MEDICATED_PATCH | TRANSDERMAL | Status: AC
  Administered 2014-10-06: 1.5 mg via TRANSDERMAL
  Filled 2014-10-06: qty 1

## 2014-10-06 MED ORDER — LACTATED RINGERS IV SOLN
Freq: Once | INTRAVENOUS | Status: AC
  Administered 2014-10-06 (×3): via INTRAVENOUS

## 2014-10-06 MED ORDER — ZOLPIDEM TARTRATE 5 MG PO TABS: 5.0000 mg | ORAL_TABLET | Freq: Every evening | ORAL | Status: DC | PRN

## 2014-10-06 MED ORDER — INFLUENZA VAC SPLIT QUAD 0.5 ML IM SUSY
0.5000 mL | PREFILLED_SYRINGE | INTRAMUSCULAR | Status: AC
  Administered 2014-10-07: 0.5 mL via INTRAMUSCULAR
  Filled 2014-10-06: qty 0.5

## 2014-10-06 MED ORDER — SENNOSIDES-DOCUSATE SODIUM 8.6-50 MG PO TABS
2.0000 | ORAL_TABLET | ORAL | Status: DC
  Administered 2014-10-06 – 2014-10-09 (×3): 2 via ORAL
  Filled 2014-10-06 (×3): qty 2

## 2014-10-06 MED ORDER — FENTANYL CITRATE (PF) 100 MCG/2ML IJ SOLN
INTRAMUSCULAR | Status: AC
  Filled 2014-10-06: qty 4

## 2014-10-06 MED ORDER — PHENYLEPHRINE HCL 10 MG/ML IJ SOLN
INTRAMUSCULAR | Status: DC | PRN
  Administered 2014-10-06 (×3): 40 ug via INTRAVENOUS

## 2014-10-06 MED ORDER — PRENATAL MULTIVITAMIN CH
1.0000 | ORAL_TABLET | Freq: Every day | ORAL | Status: DC
  Administered 2014-10-07 – 2014-10-09 (×3): 1 via ORAL
  Filled 2014-10-06 (×3): qty 1

## 2014-10-06 MED ORDER — PHENYLEPHRINE 8 MG IN D5W 100 ML (0.08MG/ML) PREMIX OPTIME
INJECTION | INTRAVENOUS | Status: DC | PRN
  Administered 2014-10-06: 60 ug/min via INTRAVENOUS

## 2014-10-06 MED ORDER — FLEET ENEMA 7-19 GM/118ML RE ENEM: 1.0000 | ENEMA | Freq: Every day | RECTAL | Status: DC | PRN

## 2014-10-06 MED ORDER — DIPHENHYDRAMINE HCL 12.5 MG/5ML PO ELIX
12.5000 mg | ORAL_SOLUTION | Freq: Four times a day (QID) | ORAL | Status: DC | PRN
  Filled 2014-10-06: qty 5

## 2014-10-06 MED ORDER — OXYCODONE-ACETAMINOPHEN 5-325 MG PO TABS
1.0000 | ORAL_TABLET | ORAL | Status: DC | PRN
  Administered 2014-10-06 – 2014-10-07 (×3): 2 via ORAL
  Administered 2014-10-07: 1 via ORAL
  Administered 2014-10-07 – 2014-10-08 (×2): 2 via ORAL
  Filled 2014-10-06: qty 1
  Filled 2014-10-06 (×4): qty 2

## 2014-10-06 MED ORDER — DEXAMETHASONE SODIUM PHOSPHATE 4 MG/ML IJ SOLN
INTRAMUSCULAR | Status: AC
  Filled 2014-10-06: qty 1

## 2014-10-06 MED ORDER — SODIUM CHLORIDE 0.9 % IJ SOLN: 3.0000 mL | INTRAMUSCULAR | Status: DC | PRN

## 2014-10-06 MED ORDER — SCOPOLAMINE 1 MG/3DAYS TD PT72
1.0000 | MEDICATED_PATCH | Freq: Once | TRANSDERMAL | Status: DC
  Administered 2014-10-06: 1.5 mg via TRANSDERMAL

## 2014-10-06 MED ORDER — IBUPROFEN 600 MG PO TABS
600.0000 mg | ORAL_TABLET | Freq: Four times a day (QID) | ORAL | Status: DC
  Administered 2014-10-06 – 2014-10-09 (×12): 600 mg via ORAL
  Filled 2014-10-06 (×12): qty 1

## 2014-10-06 MED ORDER — PHENYLEPHRINE 8 MG IN D5W 100 ML (0.08MG/ML) PREMIX OPTIME
INJECTION | INTRAVENOUS | Status: AC
  Filled 2014-10-06: qty 100

## 2014-10-06 MED ORDER — LACTATED RINGERS IV SOLN
INTRAVENOUS | Status: DC | PRN
  Administered 2014-10-06: 08:00:00 via INTRAVENOUS

## 2014-10-06 MED ORDER — MIDAZOLAM HCL 2 MG/2ML IJ SOLN
INTRAMUSCULAR | Status: AC
  Filled 2014-10-06: qty 4

## 2014-10-06 MED ORDER — PANTOPRAZOLE SODIUM 40 MG PO TBEC: 80.0000 mg | DELAYED_RELEASE_TABLET | Freq: Every day | ORAL | Status: DC

## 2014-10-06 MED ORDER — FENTANYL CITRATE (PF) 100 MCG/2ML IJ SOLN
INTRAMUSCULAR | Status: AC
  Filled 2014-10-06: qty 2

## 2014-10-06 MED ORDER — VITAMIN K1 1 MG/0.5ML IJ SOLN
INTRAMUSCULAR | Status: AC
  Filled 2014-10-06: qty 0.5

## 2014-10-06 MED ORDER — SODIUM CHLORIDE 0.9 % IJ SOLN
3.0000 mL | Freq: Two times a day (BID) | INTRAMUSCULAR | Status: DC
Start: 2014-10-07 — End: 2014-10-09

## 2014-10-06 MED ORDER — PANTOPRAZOLE SODIUM 40 MG PO TBEC
80.0000 mg | DELAYED_RELEASE_TABLET | ORAL | Status: DC
  Administered 2014-10-06: 80 mg via ORAL
  Filled 2014-10-06: qty 2

## 2014-10-06 MED ORDER — SODIUM CHLORIDE 0.9 % IJ SOLN: 9.0000 mL | INTRAMUSCULAR | Status: DC | PRN

## 2014-10-06 MED ORDER — ONDANSETRON HCL 4 MG/2ML IJ SOLN: 4.0000 mg | Freq: Four times a day (QID) | INTRAMUSCULAR | Status: DC | PRN

## 2014-10-06 MED ORDER — OXYTOCIN 10 UNIT/ML IJ SOLN
INTRAMUSCULAR | Status: AC
  Filled 2014-10-06: qty 4

## 2014-10-06 MED ORDER — SIMETHICONE 80 MG PO CHEW: 80.0000 mg | CHEWABLE_TABLET | ORAL | Status: DC | PRN

## 2014-10-06 MED ORDER — LANOLIN HYDROUS EX OINT: 1.0000 "application " | TOPICAL_OINTMENT | CUTANEOUS | Status: DC | PRN

## 2014-10-06 MED ORDER — FENTANYL CITRATE (PF) 100 MCG/2ML IJ SOLN
25.0000 ug | INTRAMUSCULAR | Status: DC | PRN
  Administered 2014-10-06: 25 ug via INTRAVENOUS

## 2014-10-06 MED ORDER — MENTHOL 3 MG MT LOZG: 1.0000 | LOZENGE | OROMUCOSAL | Status: DC | PRN

## 2014-10-06 SURGICAL SUPPLY — 28 items
CLAMP CORD UMBIL (MISCELLANEOUS) IMPLANT
CLOTH BEACON ORANGE TIMEOUT ST (SAFETY) ×3 IMPLANT
DRAPE C SECTION CLR SCREEN (DRAPES) ×3 IMPLANT
DRAPE SHEET LG 3/4 BI-LAMINATE (DRAPES) IMPLANT
DRSG OPSITE POSTOP 4X10 (GAUZE/BANDAGES/DRESSINGS) ×3 IMPLANT
DURAPREP 26ML APPLICATOR (WOUND CARE) ×3 IMPLANT
ELECT REM PT RETURN 9FT ADLT (ELECTROSURGICAL) ×3
ELECTRODE REM PT RTRN 9FT ADLT (ELECTROSURGICAL) ×1 IMPLANT
EXTRACTOR VACUUM M CUP 4 TUBE (SUCTIONS) IMPLANT
EXTRACTOR VACUUM M CUP 4' TUBE (SUCTIONS)
GLOVE BIO SURGEON STRL SZ7 (GLOVE) ×3 IMPLANT
GOWN STRL REUS W/TWL LRG LVL3 (GOWN DISPOSABLE) ×6 IMPLANT
KIT ABG SYR 3ML LUER SLIP (SYRINGE) ×3 IMPLANT
LIQUID BAND (GAUZE/BANDAGES/DRESSINGS) ×3 IMPLANT
NEEDLE HYPO 25X5/8 SAFETYGLIDE (NEEDLE) ×3 IMPLANT
NS IRRIG 1000ML POUR BTL (IV SOLUTION) ×3 IMPLANT
PACK C SECTION WH (CUSTOM PROCEDURE TRAY) ×3 IMPLANT
PAD OB MATERNITY 4.3X12.25 (PERSONAL CARE ITEMS) ×3 IMPLANT
PENCIL SMOKE EVAC W/HOLSTER (ELECTROSURGICAL) ×3 IMPLANT
SUT CHROMIC 0 CTX 36 (SUTURE) ×6 IMPLANT
SUT MON AB 4-0 PS1 27 (SUTURE) ×3 IMPLANT
SUT PDS AB 0 CT 36 (SUTURE) ×3 IMPLANT
SUT PLAIN 0 NONE (SUTURE) IMPLANT
SUT PLAIN 2 0 XLH (SUTURE) IMPLANT
SUT VIC AB 3-0 CT1 27 (SUTURE) ×2
SUT VIC AB 3-0 CT1 TAPERPNT 27 (SUTURE) ×1 IMPLANT
TOWEL OR 17X24 6PK STRL BLUE (TOWEL DISPOSABLE) ×3 IMPLANT
TRAY FOLEY CATH SILVER 14FR (SET/KITS/TRAYS/PACK) ×3 IMPLANT

## 2014-10-06 NOTE — Lactation Note (Addendum)
This note was copied from the chart of Yvette Ramatoulaye Vandyne. Lactation Consultation Note  P2, First time breastfeeding. Mother attended breastfeeding class and has lots of questions which were answered.  Many questions about pumping and going back to work. Encouraged her to enjoy breastfeeding relationship now to establish milk supply. Baby has been spitty.  Showing feeding cues. Reviewed hand expression. Mother nipples are inverted.  Prepumped w/ hand pump to evert nipples. Baby opens wide and was able to latch baby using breast compression.  Sucks and some swallows observed for approx 15 min. Discussed basics, STS and applying ebm for soreness. Suggest mother first hand express, then prepump w/ hand pump.Flange size was increaased to #30.  Mom encouraged to feed baby 8-12 times/24 hours and with feeding cues.  Mom made aware of O/P services, breastfeeding support groups, community resources, and our phone # for post-discharge questions.    Patient Name: [name] Today's Date: 10/06/2014 Reason for consult: Initial assessment   Maternal Data Has patient been taught Hand Expression?: Yes Does the patient have breastfeeding experience prior to this delivery?: No  Feeding Feeding Type: Breast Fed Length of feed: 15 min  LATCH Score/Interventions Latch: Grasps breast easily, tongue down, lips flanged, rhythmical sucking. Intervention(s): Adjust position;Assist with latch;Breast compression  Audible Swallowing: A few with stimulation  Type of Nipple: Inverted Intervention(s): Hand pump;Shells  Comfort (Breast/Nipple): Soft / non-tender     Hold (Positioning): Assistance needed to correctly position infant at breast and maintain latch.  LATCH Score: 6  Lactation Tools Discussed/Used     Consult Status Consult Status: Follow-up Date: 10/07/14 Follow-up type: In-patient    Dahlia Byes [facility] 10/06/2014, 2:04 PM

## 2014-10-06 NOTE — H&P (Signed)
  History and physical exam unchanged 

## 2014-10-06 NOTE — Progress Notes (Signed)
Fentanyl PCA 20ml wasted in sink and witnessed by [facility]

## 2014-10-06 NOTE — Brief Op Note (Signed)
10/06/2014  8:16 AM  PATIENT:  [name]  39 y.o. female  PRE-OPERATIVE DIAGNOSIS:  previous X 1  POST-OPERATIVE DIAGNOSIS:  previous X 1  PROCEDURE:  Procedure(s) with comments: CESAREAN SECTION (N/A) - Repeat edc 10/13/14 allergic to hydrocodone, oxycodone Hoy Morn, RNFA confirmed-kah  SURGEON:  Surgeon(s) and Role:    * Richardean Chimera, MD - Primary  PHYSICIAN ASSISTANT:   ASSISTANTS: none   ANESTHESIA:   spinal  EBL:  Total I/O In: 3300 [I.V.:3300] Out: 850 [Urine:150; Blood:700]  BLOOD ADMINISTERED:none  DRAINS: Urinary Catheter (Foley)   LOCAL MEDICATIONS USED:  NONE  SPECIMEN:  none  DISPOSITION OF SPECIMEN:  N/A  COUNTS:  YES  TOURNIQUET:  * No tourniquets in log *  DICTATION: .Other Dictation: Dictation Number [id number]  PLAN OF CARE: Admit to inpatient   PATIENT DISPOSITION:  PACU - hemodynamically stable.   Delay start of Pharmacological VTE agent (>24hrs) due to surgical blood loss or risk of bleeding: no

## 2014-10-06 NOTE — Anesthesia Postprocedure Evaluation (Signed)
  Anesthesia Post-op Note  Patient: [name]  Procedure(s) Performed: Procedure(s) with comments: CESAREAN SECTION (N/A) - Repeat edc 10/13/14 allergic to hydrocodone, oxycodone Hoy Morn, RNFA confirmed-kah  Patient Location: PACU  Anesthesia Type:Spinal  Level of Consciousness: awake, alert  and oriented  Airway and Oxygen Therapy: Patient Spontanous Breathing  Post-op Pain: mild  Post-op Assessment: Post-op Vital signs reviewed, Patient's Cardiovascular Status Stable, Respiratory Function Stable, Patent Airway, No signs of Nausea or vomiting, Pain level controlled, No headache, No backache and Spinal receding LLE Motor Response: No movement due to regional block   RLE Motor Response: No movement due to regional block RLE Sensation: Numbness      Post-op Vital Signs: Reviewed and stable  Last Vitals:  Filed Vitals:   10/06/14 0915  BP: 102/61  Pulse: 51  Temp: 36.3 C  Resp: 24    Complications: No apparent anesthesia complications

## 2014-10-06 NOTE — Anesthesia Postprocedure Evaluation (Signed)
  Anesthesia Post-op Note  Patient: [name]  Procedure(s) Performed: Procedure(s) with comments: CESAREAN SECTION (N/A) - Repeat edc 10/13/14 allergic to hydrocodone, oxycodone Hoy Morn, RNFA confirmed-kah  Patient Location: Mother/Baby  Anesthesia Type:Spinal  Level of Consciousness: awake, alert , oriented and patient cooperative  Airway and Oxygen Therapy: Patient Spontanous Breathing and Patient connected to nasal cannula oxygen  Post-op Pain: none  Post-op Assessment: Post-op Vital signs reviewed, Patient's Cardiovascular Status Stable, Respiratory Function Stable, Patent Airway, No headache, No backache and Patient able to bend at knees  Post-op Vital Signs: Reviewed and stable  Last Vitals:  Filed Vitals:   10/06/14 1130  BP: 110/69  Pulse: 54  Temp: 36.5 C  Resp: 16    Complications: No apparent anesthesia complications

## 2014-10-06 NOTE — Transfer of Care (Signed)
Immediate Anesthesia Transfer of Care Note  Patient: [name]  Procedure(s) Performed: Procedure(s) with comments: CESAREAN SECTION (N/A) - Repeat edc 10/13/14 allergic to hydrocodone, oxycodone Hoy Morn, RNFA confirmed-kah  Patient Location: PACU  Anesthesia Type:Spinal  Level of Consciousness: awake, alert , oriented and patient cooperative  Airway & Oxygen Therapy: Patient Spontanous Breathing  Post-op Assessment: Report given to RN and Post -op Vital signs reviewed and stable  Post vital signs: Reviewed and stable  Last Vitals:  Filed Vitals:   10/06/14 0612  BP: 103/61  Pulse: 86  Temp: 36.5 C  Resp: 16    Complications: No apparent anesthesia complications

## 2014-10-06 NOTE — Op Note (Signed)
NAMENATTALY, YEBRA NO.:  [id number]  MEDICAL RECORD NO.:  [id number]  LOCATION:  [facility]                          FACILITY:  [facility]  PHYSICIAN:  Juluis Mire, M.D.   DATE OF BIRTH:  September 27, 1975  DATE OF PROCEDURE:  10/06/2014 DATE OF DISCHARGE:                              OPERATIVE REPORT   PREOPERATIVE DIAGNOSIS:  Intrauterine pregnancy at term with prior cesarean section, desires repeat.  POSTOPERATIVE DIAGNOSIS:  Intrauterine pregnancy at term with prior cesarean section, desires repeat.  OPERATIVE PROCEDURE:  Repeat low transverse cesarean section.  SURGEON:  Juluis Mire, M.D.  ANESTHESIA:  Spinal.  ESTIMATED BLOOD LOSS:  600 mL.  PACKS:  None.  DRAINS:  Included urethral Foley.  INTRAOPERATIVE BLOOD REPLACEMENT:  None.  COMPLICATIONS:  None.  INDICATIONS:  As dictated in History and Physical.  PROCEDURE IN DETAIL:  The patient was taken to the OR, placed in supine position with left lateral tilt.  After a satisfactory level of spinal anesthesia was obtained, the abdomen was prepped out with DuraPrep and eventually draped in sterile field.  The prior skin incision was then excised.  Excision was then extended through the subcutaneous tissue. Fascia was entered sharply.  Incision was fashioned laterally.  Fascia taken off the muscle superiorly and inferiorly using both blunt and sharp dissection.  Rectus muscles were separated in the midline. Perineum was entered sharply.  Incision of perineum extended both superiorly and inferiorly.  A low-transverse bladder flap was developed. A low transverse uterine incision was begun with a knife and extended laterally using manual traction.  The patient did have light meconium fluid.  The infant was delivered with elevation of head and fundal pressure.  There was a nuchal cord x1.  It was a viable female, Apgars were 9/9.  Placenta was delivered manually.  Uterus was then exteriorized for closure.   Tubes and ovaries were unremarkable.  Uterus was closed in a running locking suture of 0 chromic using a 2-layer closure technique.  One area of bleeding was brought under control with figure-of-eights of 0 chromic.  We had good hemostasis.  Uterus was returned to the abdominal cavity.  We irrigated the pelvis.  We had good hemostasis and clear urine output.  The peritoneum was closed with a running suture of 3-0 Vicryl.  Fascia was closed with running suture of 0 PDS.  Skin was closed in running subcuticular of 4- 0 Monocryl and Dermabond.  Sponges, needle count was correct by circulating nurse x2.  Foley catheter remained clear at the time of closure.  The patient did tolerate the procedure well and was returned to the recovery room in good condition.     Juluis Mire, M.D.     JSM/MEDQ  D:  10/06/2014  T:  10/06/2014  Job:  [id number]

## 2014-10-06 NOTE — Anesthesia Procedure Notes (Addendum)
Spinal Patient location during procedure: OR Start time: 10/06/2014 7:25 AM Staffing Anesthesiologist: Mal Amabile Performed by: anesthesiologist  Preanesthetic Checklist Completed: patient identified, site marked, surgical consent, pre-op evaluation, timeout performed, IV checked, risks and benefits discussed and monitors and equipment checked Spinal Block Patient position: sitting Prep: site prepped and draped and DuraPrep Patient monitoring: heart rate, cardiac monitor, continuous pulse ox and blood pressure Approach: midline Location: L3-4 Injection technique: single-shot Needle Needle type: Sprotte  Needle gauge: 24 G Needle length: 9 cm Needle insertion depth: 5 cm Assessment Sensory level: T2 Additional Notes Patient tolerated procedure well. Adequate sensory level.

## 2014-10-06 NOTE — Addendum Note (Signed)
Addendum  created 10/06/14 1419 by Yolonda Kida, CRNA   Modules edited: Notes Section   Notes Section:  File: [id number]

## 2014-10-06 NOTE — Consult Note (Signed)
Neonatology Note:   Attendance at C-section:   I was asked by Dr. McComb to attend this repeat C/S at term. The mother is a G2P1 AB neg, GBS pos with an uncomplicated pregnancy. ROM at delivery, fluid light meconium. Infant vigorous with good spontaneous cry and tone. Needed only minimal bulb suctioning. Ap 9/9. Lungs clear to ausc in DR. To CN to care of Pediatrician.  Murl Golladay C. Sanya Kobrin, MD 

## 2014-10-07 ENCOUNTER — Encounter (HOSPITAL_COMMUNITY): Payer: Self-pay | Admitting: Obstetrics and Gynecology

## 2014-10-07 LAB — BIRTH TISSUE RECOVERY COLLECTION (PLACENTA DONATION)

## 2014-10-07 LAB — KLEIHAUER-BETKE STAIN
# Vials RhIg: 1
FETAL CELLS %: 0.1 %
QUANTITATION FETAL HEMOGLOBIN: 5 mL

## 2014-10-07 LAB — CBC
HEMATOCRIT: 32.4 % — AB (ref 36.0–46.0)
HEMOGLOBIN: 10.8 g/dL — AB (ref 12.0–15.0)
MCH: 28.1 pg (ref 26.0–34.0)
MCHC: 33.3 g/dL (ref 30.0–36.0)
MCV: 84.2 fL (ref 78.0–100.0)
Platelets: 198 10*3/uL (ref 150–400)
RBC: 3.85 MIL/uL — ABNORMAL LOW (ref 3.87–5.11)
RDW: 16.6 % — AB (ref 11.5–15.5)
WBC: 10.7 10*3/uL — AB (ref 4.0–10.5)

## 2014-10-07 MED ORDER — RHO D IMMUNE GLOBULIN 1500 UNIT/2ML IJ SOSY
300.0000 ug | PREFILLED_SYRINGE | Freq: Once | INTRAMUSCULAR | Status: AC
  Administered 2014-10-07: 300 ug via INTRAMUSCULAR
  Filled 2014-10-07: qty 2

## 2014-10-07 MED ORDER — ALUM HYDROXIDE-MAG CARBONATE 95-358 MG/15ML PO SUSP
30.0000 mL | Freq: Four times a day (QID) | ORAL | Status: DC | PRN
  Administered 2014-10-07: 30 mL via ORAL
  Filled 2014-10-07 (×2): qty 30

## 2014-10-07 NOTE — Progress Notes (Signed)
Subjective: Postpartum Day 1: Cesarean Delivery Patient reports tolerating PO.  Complains of sore throat, no dysphagia.  Objective: Vital signs in last 24 hours: Temp:  [97.3 F (36.3 C)-98 F (36.7 C)] 97.8 F (36.6 C) (08/31 0315) Pulse Rate:  [44-74] 52 (08/31 0315) Resp:  [13-31] 18 (08/31 0315) BP: (91-115)/(49-69) 104/64 mmHg (08/31 0315) SpO2:  [88 %-100 %] 99 % (08/31 0315)  Physical Exam:  General: alert and cooperative. Lungs CTA. No cervical lymphadenopathy Lochia: appropriate Uterine Fundus: firm Incision: healing well DVT Evaluation: No evidence of DVT seen on physical exam. Negative Homan's sign. No cords or calf tenderness. No significant calf/ankle edema.   Recent Labs  10/05/14 1030 10/07/14 0528  HGB 11.6* 10.8*  HCT 34.8* 32.4*    Assessment/Plan: Status post Cesarean section. Doing well postoperatively.  Continue current care. Cepacol lozenges CURTIS,CAROL G 10/07/2014, 7:48 AM

## 2014-10-08 LAB — RH IG WORKUP (INCLUDES ABO/RH)
ABO/RH(D): AB NEG
GESTATIONAL AGE(WKS): 38.6
UNIT DIVISION: 0

## 2014-10-08 NOTE — Clinical Social Work Maternal (Signed)
CLINICAL SOCIAL WORK MATERNAL/CHILD NOTE  Patient Details  Name: [name] MRN: [id number] Date of Birth: 02/08/1975  Date:  10/08/2014  Clinical Social Worker Initiating Note:  Loleta Books, LCSW Date/ Time Initiated:  10/08/14/1200     Child's Name:  Jena Gauss   Legal Guardian:  Mother   Need for Interpreter:  None   Date of Referral:  05-Jan-2015     Reason for Referral:  History of depression/anxiety, Current Substance Use/Substance Use During Pregnancy    Referral Source:  [facility]   Address:  [address] [place], [place] [address]  Phone number:  [phone]   Household Members:  Minor Children, Significant Other   Natural Supports (not living in the home):  Extended Family, Immediate Family   Professional Supports: None   Employment: Full-time   Type of Work: Runner, broadcasting/film/video   Education:    N/A  Surveyor, quantity Resources:  Media planner   Other Resources:    N/A  Cultural/Religious Considerations Which May Impact Care:  None reported  Strengths:  Ability to meet basic needs , Home prepared for child    Risk Factors/Current Problems:   1)Mental Health Concerns: MOB presents with history of anxiety and depression. She endorsed worsening of symptoms during the pregnancy, but did not clarify symptoms. MOB denied current mental health concerns as she transitions postpartum. 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 MOB denied any substance use during the pregnancy. Infant's UDS is negative and MDS is pending.   Cognitive State:  Able to Concentrate , Alert , Goal Oriented , Linear Thinking    Mood/Affect:  Flat , Limited range in affect  CSW Assessment:  CSW received request for consult due to MOB presenting with a history of anxiety, depression, and THC use.  MOB presented as difficult to engage and as a limited historian. She only answered questions when directly prompted, answers were short and concise, and presented with limited  readiness to process how she feels as she transitions postpartum. The FOB was also in the room, but he was attempting to soothe the infant. The infant was noted to be crying during the entire assessment.   When asked how MOB feels as she transitions postpartum, she stated that the infant has been crying and feeding "all night". She shared that she has an older child (64 years old), but that the experience has been "different" since her first child did not cry as much.  CSW attempted to normalize the feelings of frustration, exhaustion, and being overwhelmed, but MOB responded minimally. MOB shared that she has had positive interactions with the infant, and reported that she is excited about the birth of the infant.  MOB's affect incongruent with these statements.  MOB confirmed having family support as she transitions home, and confirmed that the home is prepared for the infant.  MOB was a limited historian related to her anxiety and depression. She confirmed symptoms during the pregnancy, but never clarified her symptoms. MOB shared that she did speak to her doctor about her symptoms, and they were willing to prescribe her medications.  MOB declined medications, and shared that she currently feels "okay".  CSW provided education on perinatal mood and anxiety disorders, and MOB acknowledged that symptoms may continue to be present postpartum. MOB acknowledged that she can return to her OB provider for medications.  Per MOB, THC use was "awhile ago", and denied use during pregnancy. She was vague and reported that last use was more than a year  ago. MOB verbalized understanding of the hospital drug screen policy, and denied questions or concerns related to the collection of the infant's urine and meconium.  CSW continued to provide support as MOB voiced frustration with hospital experience.  CSW validated and normalized her feelings, and encouraged the MOB to voice her feelings and concerns when she is  contacted postpartum about her admission experience.   MOB denied questions, concerns, or needs at this time.   CSW Plan/Description:   1)Patient/Family Education: Perinatal mood and anxiety disorders 2) CSW to monitor infant's drug screens, and will make a CPS report if positive. 3)No Further Intervention Required/No Barriers to Discharge    Kelby Fam 10/08/2014, 12:49 PM

## 2014-10-08 NOTE — Lactation Note (Signed)
This note was copied from the chart of [name]. Lactation Consultation Note; Mom upset that baby has been crying all night and she has not seen lactation. RN has assisted mom numerous times through the night.. Reports baby has been nursing for over an hour.Mom nursing baby on left breast- is letting baby slide to the tip of nipple. Offered assist with right breast- mom reports she is having trouble getting her to latch to that side. Mom needed much review with holding breast throughout the feeding for good support and to keep the baby close to the breast. Baby latched well and nursed for 15 min- getting sleepy. Mom changed diaper and baby frantic again. Offered supplement a small amount of formula but mom wants to latch again. Baby latched well and nursing when I left room. Reviewed cluster feeding and encouraged to sleep whenever baby is sleeping. No questions at present. To call for assist prn  Patient Name: [name] ZOXWR'U Date: 10/08/2014 Reason for consult: Follow-up assessment   Maternal Data Formula Feeding for Exclusion: No Has patient been taught Hand Expression?: Yes Does the patient have breastfeeding experience prior to this delivery?: No  Feeding Feeding Type: Breast Fed Length of feed: 15 min  LATCH Score/Interventions Latch: Grasps breast easily, tongue down, lips flanged, rhythmical sucking.  Audible Swallowing: A few with stimulation  Type of Nipple: Everted at rest and after stimulation  Comfort (Breast/Nipple): Filling, red/small blisters or bruises, mild/mod discomfort  Problem noted: Mild/Moderate discomfort Interventions (Mild/moderate discomfort): Comfort gels  Hold (Positioning): Assistance needed to correctly position infant at breast and maintain latch. Intervention(s): Breastfeeding basics reviewed;Support Pillows  LATCH Score: 7  Lactation Tools Discussed/Used     Consult Status Consult Status: Follow-up Date:  10/09/14 Follow-up type: In-patient    Pamelia Hoit 10/08/2014, 8:56 AM

## 2014-10-08 NOTE — Progress Notes (Signed)
When I got here at 2300 on 8/31 and was being introduced to the patient by the off going nurse, the pt was upset that the baby had been feeding so much. The off going nurse and I both reinforced that this was normal for the baby's age and that it was likely that the baby would cluster feed through the night. We explained what that ment to her. Throughout the night the pt was upset about how much the baby was feeding and that she was not getting any sleep. At one point she called be in the room and stated that "it was not humanly possible for a baby to eat this much". I reinforced several times that the baby was cluster feeding. I offered to help her latch the baby several times and when I did help her latch gave her suggestions on what she could do correct he technique so that she would not be as sore (use more support pillows, make sure the baby was on as deep as possible, keep the baby close to the breast instead of letting her slide to the end of the nipple etc.). I also delayed my assessment as long as I could because I could tell that it was overwhelming to her when I asked if I could do it. I was in the room several times and each time I offered my assistance in any way that I could, at one point I even offered to take the baby for a little while and she declined. The nurse tech that was working with her last night also reinforced that the baby was cluster feeding. She of to take the baby and the pt declined so she stayed in the room with the pt and held the baby for over 30 min to give the pt a "break". She put the weighing the baby until this morning so the pt would not be upset. When I went in this morning to give the pt her Motrin and update the baby's chart (because I could not put it off any longer), the pt got very upset and stated that the baby had fed all night long and all we cared about was numbers and the computer and no one had tried to help her the entire time she had been here. I asked her what  I could do at that moment to help her and she turned her face away from me and once again stated that all we cared about was the computer and no one helped her and she didn't understand how it was humanly possible for the baby to eat so much. I reminded nicely that several of [place] had told her that the baby would likely cluster feed all night and that the nurse tech and I both had offered her help several times through the night and delayed charting but that charting was the only way the the doctors would know what the baby had been doing so that is why it was important. The husband proceeded to help me update the charting. I then suggested the possibility of supplementing with him and told him it was not something they had to do but I at this point there was nothing else for me to offer. I discussed this option with him told him to talk it over with the pt and that if this was something that they decided they wanted to do to let us know. I then gave report to the oncoming nurse and introduced her to them.

## 2014-10-08 NOTE — Progress Notes (Signed)
Subjective: Postpartum Day 2: Cesarean Delivery Patient reports tolerating PO, + flatus and no problems voiding.    Objective: Vital signs in last 24 hours: Temp:  [98 F (36.7 C)-98.1 F (36.7 C)] 98.1 F (36.7 C) (09/01 0621) Pulse Rate:  [56-86] 59 (09/01 0621) Resp:  [18] 18 (09/01 0621) BP: (99-102)/(60-71) 99/60 mmHg (09/01 0621) SpO2:  [99 %] 99 % (09/01 7829)  Physical Exam:  General: alert and frustrated with breastfeeding Lochia: appropriate Uterine Fundus: firm Incision: healing well DVT Evaluation: No evidence of DVT seen on physical exam. Negative Homan's sign. No cords or calf tenderness. No significant calf/ankle edema.   Recent Labs  10/05/14 1030 10/07/14 0528  HGB 11.6* 10.8*  HCT 34.8* 32.4*    Assessment/Plan: Status post Cesarean section. Doing well postoperatively.  Lactation consultation.  CURTIS,CAROL G 10/08/2014, 7:46 AM

## 2014-10-09 ENCOUNTER — Encounter (HOSPITAL_COMMUNITY): Payer: Self-pay | Admitting: *Deleted

## 2014-10-09 MED ORDER — IBUPROFEN 600 MG PO TABS: 600.0000 mg | ORAL_TABLET | Freq: Four times a day (QID) | ORAL | Status: DC

## 2014-10-09 MED ORDER — OXYCODONE-ACETAMINOPHEN 5-325 MG PO TABS: 1.0000 | ORAL_TABLET | ORAL | Status: DC | PRN

## 2014-10-09 NOTE — Lactation Note (Addendum)
This note was copied from the chart of [name]. Lactation Consultation Note  Patient Name: Yvette Chaunte Hornbeck WGNFA'O Date: 10/09/2014 Reason for consult: Follow-up assessment  Baby is 77 hours old, 13 % weight loss, 7-12.7 oz ,  Has been consistently to the breast 15 -60 mins , breast are still soft , although  Mom feels fuller. 12 stools , 10 wets , ( 5 wets in the last 24 hours , and 4 stools ) , AT 64 - 4.7 . Per  Mom baby cluster fed all night , didn't seem satisfied. Has been supplemented  X 3 28-30 ml form a bottle. LC noted when the baby latched chewing and not a wave like movement.  When in a pattern , swallows noted. 5 F feeding tube added while baby latched and baby was able to  Get out of the chewing mode while latched. Tolerated the 4F feeding tube both breast and fed 15 -20 mins  Both breast,, multiply swallows noted. Sore nipple and engorgement prevention and tx reviewed.  Mom plans to rent a DEBP form [facility] today ( already has the DEBP .  Has comfort gels . And hand pump. LC instructed mom on the use shells,   Oral assessment by LC - Short labial Frenulum above the gum line able top stretch upper lip well with exam and at the breast with latch . Raises tongue up , but not above the corners of the mouth, able to stretch tongue forward short distance over gum line . LC suspects  A questionable short posterior frenulum , causing the baby to chew while at the breast, improved with flow from SNS.  Also probably the cause of moms sore nipples and bruising on the face of the nipple . And high palate.   LC recommends if the sore nipples are slow to improve, weight loss is slow , this baby should be assessed " Orally by an oral specialist Also LC advised mom if her Pedis office doesn't have a Lactation Consultant to cal back [facility] for a F/U LC apt to re-check latch after milk comes in  And ASSESS milk supply with the next week.   LC plan mom will continue to work on  latching and supplementing at the breast until weight increases and post pump after 5-6 feeding  To establish and protect milk supply. Feed the Baby at least every 3 hours , if hungry at 2 hours, feed the baby , and keep pumping.  If she can't latch the baby or to sore , supplement with a brought base nipple . Baby needs calories to grow.   Pre- weight - 3540 g, 7-12.9 oz ,  post weight - 3555g , 7-13.4 oz,  Amount transferred: 3.5 ml  Amount supplemented: 11.5 ml Total off the left breast = 15 ml   Pre- weight - 3555 g , 7-13.4 oz Post -weight- 3575 g , 7-14 Amount transferred: 8 ml  Amount supplemented: 12 ml  Total amount= 20 ml   Total for feeding - 35 ml      Maternal Data Has patient been taught Hand Expression?: Yes  Feeding Feeding Type: Breast Fed Length of feed: 15 min  LATCH Score/Interventions Latch: Grasps breast easily, tongue down, lips flanged, rhythmical sucking. Intervention(s): Adjust position;Assist with latch;Breast massage;Breast compression  Audible Swallowing: Spontaneous and intermittent  Type of Nipple: Everted at rest and after stimulation  Comfort (Breast/Nipple): Soft / non-tender     Hold (Positioning): Assistance needed to correctly  position infant at breast and maintain latch. Intervention(s): Breastfeeding basics reviewed;Support Pillows;Position options;Skin to skin  LATCH Score: 9  Lactation Tools Discussed/Used Tools: Shells;Pump;Comfort gels;34F feeding tube / Syringe;Supplemental Nutrition System Shell Type: Inverted Breast pump type: Manual WIC Program: No Pump Review: Setup, frequency, and cleaning;Milk Storage Initiated by:: MAI  Date initiated:: 10/09/14   Consult Status Consult Status: Complete (see LC note ) Date: 10/09/14 Follow-up type: In-patient    Kathrin Greathouse 10/09/2014, 1:29 PM

## 2014-10-09 NOTE — Discharge Summary (Signed)
Obstetric Discharge Summary Reason for Admission: cesarean section Prenatal Procedures: ultrasound Intrapartum Procedures: cesarean: low cervical, transverse Postpartum Procedures: none Complications-Operative and Postpartum: none HEMOGLOBIN  Date Value Ref Range Status  10/07/2014 10.8* 12.0 - 15.0 g/dL Final   HCT  Date Value Ref Range Status  10/07/2014 32.4* 36.0 - 46.0 % Final    Physical Exam:  General: alert and cooperative Lochia: appropriate Uterine Fundus: firm Incision: healing well DVT Evaluation: No evidence of DVT seen on physical exam. Negative Homan's sign. No cords or calf tenderness. No significant calf/ankle edema.  Discharge Diagnoses: Term Pregnancy-delivered  Discharge Information: Date: 10/09/2014 Activity: pelvic rest Diet: routine Medications: PNV, Ibuprofen and Percocet Condition: stable Instructions: refer to practice specific booklet Discharge to: home   Newborn Data: Live born female  Birth Weight: 8 lb 14.9 oz (4051 g) APGAR: 9, 9  Home with mother.  CURTIS,CAROL G 10/09/2014, 8:08 AM

## 2015-07-15 ENCOUNTER — Other Ambulatory Visit: Payer: Self-pay | Admitting: Bariatrics

## 2015-07-15 DIAGNOSIS — R1310 Dysphagia, unspecified: Secondary | ICD-10-CM

## 2015-07-22 ENCOUNTER — Ambulatory Visit
Admission: RE | Admit: 2015-07-22 | Discharge: 2015-07-22 | Disposition: A | Discharge status: Home / Self Care | Payer: BC Managed Care – PPO | Source: Ambulatory Visit | Attending: Bariatrics | Admitting: Bariatrics

## 2015-07-22 DIAGNOSIS — Z9889 Other specified postprocedural states: Secondary | ICD-10-CM | POA: Diagnosis not present

## 2015-07-22 DIAGNOSIS — R1310 Dysphagia, unspecified: Secondary | ICD-10-CM | POA: Diagnosis present

## 2016-05-26 HISTORY — PX: COLONOSCOPY WITH ESOPHAGOGASTRODUODENOSCOPY (EGD): SHX5779

## 2017-05-08 NOTE — H&P (Signed)
Patient name  [name], [name] [id number] CSN# [id number]  [facility] LL[facility]CCOMB,Yanice Maqueda S, MD 05/08/2017 6:47 AM

## 2017-05-08 NOTE — H&P (Signed)
NAMJanalee Contreras:  Contreras, [name]           ACCOUNT NO.:  [id number]  MEDICAL RECORD NO.:  [id number]  LOCATION:                                 FACILITY:  PHYSICIAN:  Juluis MireJohn S. Alpha Chouinard, M.D.        DATE OF BIRTH:  DATE OF ADMISSION: DATE OF DISCHARGE:                             HISTORY & PHYSICAL   The date of [facility] [place] [address].  In relation to the present admission, the patient is a 42 year old gravida 2, para 2 female, who presents for laparoscopic bilateral tubal ligation.  Her cycles are regular, with pains at time.  No change in flow or significant dysmenorrhea.  She is desirous of permanent sterilization.  Alternative forms of birth control were discussed.  The potential irreversibility of sterilization was explained.  Failure rate of 1 in 200 is quoted.  Failures can be in the form of ectopic pregnancy requiring further surgical management.  The patient expressed understanding and wishes to proceed with permanent sterilization.  ALLERGIES:  In terms of allergies, she is allergic to shellfish and oxycodone.  MEDICATIONS:  She is on pantoprazole.  She uses bupropion, Prozac, Sprintec, phentermine.  PAST MEDICAL HISTORY:  Usual childhood diseases.  Does have a history of arthritis as well as gallbladder disease.  She does have a hiatal hernia with reflux.  SURGICAL HISTORY:  She has had 2 C-sections.  She has had her gallbladder removed.  SOCIAL HISTORY:  Reveals no tobacco or alcohol use.  FAMILY HISTORY:  Noncontributory.  PHYSICAL EXAMINATION:  VITAL SIGNS:  The patient is afebrile.  Stable vital signs. HEENT:  The patient is normocephalic.  Pupils are equal, round, and reactive to light and accommodation.  Extraocular movements are intact. Sclerae and conjunctivae clear.  Oropharynx clear. NECK:  Without thyromegaly. BREASTS:  No discrete masses. LUNGS:  Clear. CARDIOVASCULAR:  Regular rhythm and rate  without murmurs or gallops. ABDOMEN:  Benign.  No mass, organomegaly, or tenderness. PELVIC:  Normal external genitalia.  Vaginal mucosa is clear.  Cervix unremarkable.  Uterus normal size, shape, and contour.  Adnexa free of mass or tenderness. EXTREMITIES:  Trace edema. NEUROLOGIC:  Grossly within normal limits.  IMPRESSION:  Multiparity, desires sterility.  PLAN:  The patient will undergo laparoscopic bilateral tubal ligation. Risks of surgery have been discussed including risk of infection.  The risk of hemorrhage could require transfusion with the risk of AIDS or hepatitis. Risks of injury to adjacent organs such as bladder and bowel that could require further exploratory surgery, risks of deep venous thrombosis and pulmonary embolus.  The patient expressed understanding of the risks, complications, and alternatives.     Juluis MireJohn S. Lorina Duffner, M.D.     JSM/MEDQ  D:  05/08/2017  T:  05/08/2017  Job:  [id number]

## 2017-07-24 NOTE — H&P (Signed)
Patient name  [name], [name] DICTATION# [id number] CSN# [id number]  [facility],[name], MD 07/24/2017 5:49 AM

## 2017-07-25 NOTE — H&P (Signed)
NAME: [name], Taneshia F. MEDICAL RECORD ZO:[id number] ACCOUNT [id number] DATE OF BIRTH:1975-04-30 FACILITY: [facility] LOCATION:  PHYSICIAN:Advay Volante Lisbeth PlyS. Jaiden Dinkins, MD  HISTORY AND PHYSICAL  DATE OF ADMISSION:  08/13/2017  [facility] [address] [address] [facility] [address]  HISTORY OF PRESENT ILLNESS:  The patient is a 42 year old gravida 2, para 2 female who presents for laparoscopic bilateral tubal ligation.  The patient is desirous of permanent sterilization. The potential irreversibility of sterilization was explained.   Failure rate of 1 in 300 is quoted.  Failures can be in the form of ectopic pregnancy requiring further surgical management. Alternatives have been discussed.  The patient does desire to proceed with permanent sterilization.  ALLERGIES:  SHE HAS SEASONAL ALLERGIES.  ALLERGIC TO SHELLFISH AND OXYCODONE.  MEDICATIONS:  She is on the Pantapozal, Prozac, she is on birth control pills.  She is taking phentermine for weight loss.  PAST MEDICAL HISTORY:  She has a history of arthritis, urinary tract infections.  Gallbladder disease with her gallbladder removed.  PAST SURGICAL HISTORY:  She has had 2 cesarean sections.  She had a cholecystectomy and she had a vertical sleeve gastrotomy.  SOCIAL HISTORY:  Reveals no tobacco or alcohol use.  FAMILY HISTORY:  There is a history of colon cancer, hypertension.  REVIEW OF SYSTEMS:  Noncontributory.  PHYSICAL EXAMINATION: VITAL SIGNS:  Afebrile, stable vital signs. HEENT:  The patient is normocephalic.  Pupils equal, round, reactive to light and accommodation.  Extraocular movements are intact.  Sclerae and conjunctivae were clear.  Oropharynx clear. LUNGS:  Clear. HEART:  Regular rhythm and rate without murmurs or gallops. ABDOMEN:  Benign.  Well-healed low transverse incision. PELVIC:  Normal external genitalia.  Vaginal mucosa is clear.  Cervix unremarkable.  Uterus normal size, shape and contour.  Adnexa free of  masses or tenderness.  ASSESSMENT: 1. Multiparity, desires sterility. 2. Does have a gastric sleeve in place.  PLAN:  The patient will undergo laparoscopic bilateral tubal fulguration.  The risk of surgery explained including the risk of infection.  Risk of hemorrhage that could require transfusion with the risk of AIDS and hepatitis.  Risk of injury to adjacent  organs including bladder, bowel, ureters that could require further exploratory surgery.  Risk of deep venous thrombosis and pulmonary embolus.  The patient expressed understanding of indications, potential risks and alternatives.        TN/NUANCE  D:07/24/2017 T:07/24/2017 JOB:[id number]

## 2017-08-02 ENCOUNTER — Encounter (HOSPITAL_BASED_OUTPATIENT_CLINIC_OR_DEPARTMENT_OTHER): Payer: Self-pay | Admitting: *Deleted

## 2017-08-03 ENCOUNTER — Other Ambulatory Visit: Payer: Self-pay

## 2017-08-03 ENCOUNTER — Encounter (HOSPITAL_BASED_OUTPATIENT_CLINIC_OR_DEPARTMENT_OTHER): Payer: Self-pay | Admitting: *Deleted

## 2017-08-03 NOTE — Progress Notes (Signed)
Spoke w/ pt via phone for pre-op interview.  Npo after mn.  Arrive at 0530.  Getting cbc and hCG serum done Monday 08-06-2017 @ 1300.  Will take protonix, sprintec, and prozac am dos w/ sips of water.

## 2017-08-06 ENCOUNTER — Inpatient Hospital Stay (HOSPITAL_COMMUNITY): Admission: RE | Admit: 2017-08-06 | Payer: BC Managed Care – PPO | Source: Ambulatory Visit

## 2017-08-13 ENCOUNTER — Ambulatory Visit (HOSPITAL_BASED_OUTPATIENT_CLINIC_OR_DEPARTMENT_OTHER)
Admission: RE | Admit: 2017-08-13 | Payer: BC Managed Care – PPO | Source: Ambulatory Visit | Admitting: Obstetrics and Gynecology

## 2017-08-13 HISTORY — DX: Bariatric surgery status: Z98.84

## 2017-08-13 HISTORY — DX: Presence of spectacles and contact lenses: Z97.3

## 2017-08-13 SURGERY — LIGATION, FALLOPIAN TUBE, LAPAROSCOPIC
Anesthesia: General | Laterality: Bilateral

## 2021-03-02 ENCOUNTER — Emergency Department
Admission: EM | Admit: 2021-03-02 | Discharge: 2021-03-02 | Discharge status: Against Medical Advice | Payer: BC Managed Care – PPO | Attending: Emergency Medicine | Admitting: Emergency Medicine

## 2021-03-02 ENCOUNTER — Emergency Department: Payer: BC Managed Care – PPO

## 2021-03-02 ENCOUNTER — Other Ambulatory Visit: Payer: Self-pay

## 2021-03-02 DIAGNOSIS — J3489 Other specified disorders of nose and nasal sinuses: Secondary | ICD-10-CM | POA: Diagnosis not present

## 2021-03-02 DIAGNOSIS — Z5321 Procedure and treatment not carried out due to patient leaving prior to being seen by health care provider: Secondary | ICD-10-CM | POA: Diagnosis not present

## 2021-03-02 DIAGNOSIS — Z20822 Contact with and (suspected) exposure to covid-19: Secondary | ICD-10-CM | POA: Insufficient documentation

## 2021-03-02 DIAGNOSIS — R0789 Other chest pain: Secondary | ICD-10-CM | POA: Insufficient documentation

## 2021-03-02 LAB — CBC
HCT: 38.2 % (ref 36.0–46.0)
Hemoglobin: 12.7 g/dL (ref 12.0–15.0)
MCH: 28.7 pg (ref 26.0–34.0)
MCHC: 33.2 g/dL (ref 30.0–36.0)
MCV: 86.4 fL (ref 80.0–100.0)
Platelets: 384 10*3/uL (ref 150–400)
RBC: 4.42 MIL/uL (ref 3.87–5.11)
RDW: 12 % (ref 11.5–15.5)
WBC: 6.6 10*3/uL (ref 4.0–10.5)
nRBC: 0 % (ref 0.0–0.2)

## 2021-03-02 LAB — BASIC METABOLIC PANEL
Anion gap: 9 (ref 5–15)
BUN: 12 mg/dL (ref 6–20)
CO2: 22 mmol/L (ref 22–32)
Calcium: 9.5 mg/dL (ref 8.9–10.3)
Chloride: 105 mmol/L (ref 98–111)
Creatinine, Ser: 0.93 mg/dL (ref 0.44–1.00)
GFR, Estimated: >60 mL/min (ref >60)
Glucose, Bld: 121 mg/dL — ABNORMAL HIGH (ref 70–99)
Potassium: 3.4 mmol/L — ABNORMAL LOW (ref 3.5–5.1)
Sodium: 136 mmol/L (ref 135–145)

## 2021-03-02 LAB — TROPONIN I (HIGH SENSITIVITY): Troponin I (High Sensitivity): <2 ng/L (ref <18)

## 2021-03-02 LAB — RESP PANEL BY RT-PCR (FLU A&B, COVID) ARPGX2
Influenza A by PCR: NEGATIVE
Influenza B by PCR: NEGATIVE
SARS Coronavirus 2 by RT PCR: NEGATIVE

## 2021-03-02 NOTE — ED Triage Notes (Signed)
Pt presents to ER c/o chest pain and sinus congestion that has been going on around 2 days.  Pt reports chest pain feels like a pressure in nature.  Pt denies any sick contacts.  Pt A&O x4 at this time in NAD.

## 2021-07-15 ENCOUNTER — Other Ambulatory Visit: Payer: Self-pay

## 2021-07-15 ENCOUNTER — Emergency Department: Payer: BC Managed Care – PPO

## 2021-07-15 ENCOUNTER — Emergency Department
Admission: EM | Admit: 2021-07-15 | Discharge: 2021-07-15 | Disposition: A | Discharge status: Home / Self Care | Payer: BC Managed Care – PPO | Attending: Emergency Medicine | Admitting: Emergency Medicine

## 2021-07-15 DIAGNOSIS — B9681 Helicobacter pylori [H. pylori] as the cause of diseases classified elsewhere: Secondary | ICD-10-CM

## 2021-07-15 DIAGNOSIS — R1013 Epigastric pain: Secondary | ICD-10-CM | POA: Diagnosis present

## 2021-07-15 DIAGNOSIS — R1084 Generalized abdominal pain: Secondary | ICD-10-CM

## 2021-07-15 DIAGNOSIS — K2980 Duodenitis without bleeding: Secondary | ICD-10-CM | POA: Insufficient documentation

## 2021-07-15 LAB — COMPREHENSIVE METABOLIC PANEL
ALT: 20 U/L (ref 0–44)
AST: 24 U/L (ref 15–41)
Albumin: 4 g/dL (ref 3.5–5.0)
Alkaline Phosphatase: 54 U/L (ref 38–126)
Anion gap: 5 (ref 5–15)
BUN: 14 mg/dL (ref 6–20)
CO2: 29 mmol/L (ref 22–32)
Calcium: 9.4 mg/dL (ref 8.9–10.3)
Chloride: 103 mmol/L (ref 98–111)
Creatinine, Ser: 1 mg/dL (ref 0.44–1.00)
GFR, Estimated: >60 mL/min (ref >60)
Glucose, Bld: 97 mg/dL (ref 70–99)
Potassium: 3.8 mmol/L (ref 3.5–5.1)
Sodium: 137 mmol/L (ref 135–145)
Total Bilirubin: 0.4 mg/dL (ref 0.3–1.2)
Total Protein: 7.8 g/dL (ref 6.5–8.1)

## 2021-07-15 LAB — URINALYSIS, ROUTINE W REFLEX MICROSCOPIC
Bilirubin Urine: NEGATIVE
Glucose, UA: NEGATIVE mg/dL
Hgb urine dipstick: NEGATIVE
Ketones, ur: NEGATIVE mg/dL
Leukocytes,Ua: NEGATIVE
Nitrite: NEGATIVE
Protein, ur: NEGATIVE mg/dL
Specific Gravity, Urine: 1.003 — ABNORMAL LOW (ref 1.005–1.030)
pH: 7 (ref 5.0–8.0)

## 2021-07-15 LAB — CBC
HCT: 42 % (ref 36.0–46.0)
Hemoglobin: 13.5 g/dL (ref 12.0–15.0)
MCH: 28.4 pg (ref 26.0–34.0)
MCHC: 32.1 g/dL (ref 30.0–36.0)
MCV: 88.4 fL (ref 80.0–100.0)
Platelets: 370 10*3/uL (ref 150–400)
RBC: 4.75 MIL/uL (ref 3.87–5.11)
RDW: 12.7 % (ref 11.5–15.5)
WBC: 7.2 10*3/uL (ref 4.0–10.5)
nRBC: 0 % (ref 0.0–0.2)

## 2021-07-15 LAB — LIPASE, BLOOD: Lipase: 45 U/L (ref 11–51)

## 2021-07-15 MED ORDER — LIDOCAINE VISCOUS HCL 2 % MT SOLN
15.0000 mL | Freq: Once | OROMUCOSAL | Status: AC
  Administered 2021-07-15: 15 mL via ORAL
  Filled 2021-07-15: qty 15

## 2021-07-15 MED ORDER — ONDANSETRON 4 MG PO TBDP
4.0000 mg | ORAL_TABLET | Freq: Three times a day (TID) | ORAL | 0 refills | Status: AC | PRN
Start: 2021-07-15 — End: ?

## 2021-07-15 MED ORDER — DICYCLOMINE HCL 10 MG PO CAPS
20.0000 mg | ORAL_CAPSULE | Freq: Once | ORAL | Status: AC
  Administered 2021-07-15: 20 mg via ORAL
  Filled 2021-07-15: qty 2

## 2021-07-15 MED ORDER — PANTOPRAZOLE SODIUM 40 MG PO TBEC: 40.0000 mg | DELAYED_RELEASE_TABLET | Freq: Two times a day (BID) | ORAL | 0 refills | Status: AC

## 2021-07-15 MED ORDER — PANTOPRAZOLE SODIUM 40 MG IV SOLR
40.0000 mg | Freq: Once | INTRAVENOUS | Status: AC
  Administered 2021-07-15: 40 mg via INTRAVENOUS
  Filled 2021-07-15: qty 10

## 2021-07-15 MED ORDER — ALUM & MAG HYDROXIDE-SIMETH 200-200-20 MG/5ML PO SUSP
30.0000 mL | Freq: Once | ORAL | Status: AC
  Administered 2021-07-15: 30 mL via ORAL
  Filled 2021-07-15: qty 30

## 2021-07-15 MED ORDER — AMOXICILLIN 500 MG PO CAPS: 1000.0000 mg | ORAL_CAPSULE | Freq: Two times a day (BID) | ORAL | 0 refills | Status: AC

## 2021-07-15 MED ORDER — DICYCLOMINE HCL 20 MG PO TABS
20.0000 mg | ORAL_TABLET | Freq: Three times a day (TID) | ORAL | 0 refills | Status: AC | PRN
Start: 2021-07-15 — End: ?

## 2021-07-15 MED ORDER — IOHEXOL 300 MG/ML  SOLN
100.0000 mL | Freq: Once | INTRAMUSCULAR | Status: AC | PRN
  Administered 2021-07-15: 100 mL via INTRAVENOUS

## 2021-07-15 MED ORDER — METRONIDAZOLE 500 MG PO TABS: 500.0000 mg | ORAL_TABLET | Freq: Two times a day (BID) | ORAL | 0 refills | Status: AC

## 2021-07-15 MED ORDER — CLARITHROMYCIN 500 MG PO TABS: 500.0000 mg | ORAL_TABLET | Freq: Two times a day (BID) | ORAL | 0 refills | Status: AC

## 2021-07-15 NOTE — ED Triage Notes (Signed)
Pt to ED  via POV from home. Pt reports abdominal pain and back for 3-4 weeks.   Pt states was seen by PCP and told she possibly has H.pylori and on her scan they think they saw an ileus and needs to be evaluated by the ER.   Pt also states when she tries to eat her abdomen gets very distended and she gets full very fast.

## 2021-07-15 NOTE — ED Notes (Signed)
DC ppw provided to patient. Pt questions, followup and rx information reviewed. Pt decline vs at time of dc and sent to lobby alert and oriented at this time 

## 2021-07-15 NOTE — ED Provider Notes (Signed)
[facility] Provider Note    Event Date/Time   First MD Initiated Contact with Patient 07/15/21 1716     (approximate)   History   Abdominal Pain   HPI  [name] is a 46 y.o. female here with abdominal pain.  The patient has had several weeks of ongoing, persistently worsening epigastric abdominal discomfort, decreased bowel movement frequency, and bloating.  She was just seen by her PCP and actually had a positive H. pylori test.  She also had an x-ray which was read as possible ileus so she was sent to the ED for evaluation.  Patient states her symptoms have been fairly constant but progressively worsening over the last several weeks.  She has had decreased appetite.  She states she feels very bloated with some epigastric discomfort when she does eat.  Denies any changes in her bowel movements other than decreased bowel movement frequency.  No changes in her diet.  Of note, she does have a history of gastric bypass.  No history of obstructions.  No fevers or chills.  No nausea or vomiting.     Physical Exam   Triage Vital Signs: ED Triage Vitals [07/15/21 1548]  Enc Vitals Group     BP 106/78     Pulse Rate 93     Resp 18     Temp 98.4 F (36.9 C)     Temp Source Oral     SpO2 95 %     Weight 217 lb (98.4 kg)     Height 5\' 9"  (1.753 m)     Head Circumference      Peak Flow      Pain Score 6     Pain Loc      Pain Edu?      Excl. in [place]?     Most recent vital signs: Vitals:   07/15/21 1548  BP: 106/78  Pulse: 93  Resp: 18  Temp: 98.4 F (36.9 C)  SpO2: 95%     General: Awake, no distress.  CV:  Good peripheral perfusion.  Regular rate and rhythm.  No murmurs. Resp:  Normal effort.  Lungs clear bilaterally. Abd:  No distention.  Minimal epigastric tenderness, no rebound, no guarding. Other:  Moist mucous membranes.   ED Results / Procedures / Treatments   Labs (all labs ordered are listed, but only abnormal results  are displayed) Labs Reviewed  URINALYSIS, ROUTINE W REFLEX MICROSCOPIC - Abnormal; Notable for the following components:      Result Value   Color, Urine STRAW (*)    APPearance CLEAR (*)    Specific Gravity, Urine 1.003 (*)    All other components within normal limits  LIPASE, BLOOD  COMPREHENSIVE METABOLIC PANEL  CBC     EKG    RADIOLOGY CT abdomen/pelvis: Status post gastric sleeve, no acute findings   I also independently reviewed and agree with radiologist interpretations.   PROCEDURES:  Critical Care performed: No  MEDICATIONS ORDERED IN ED: Medications  dicyclomine (BENTYL) capsule 20 mg (20 mg Oral Given 07/15/21 1751)  alum & mag hydroxide-simeth (MAALOX/MYLANTA) 200-200-20 MG/5ML suspension 30 mL (30 mLs Oral Given 07/15/21 1751)    And  lidocaine (XYLOCAINE) 2 % viscous mouth solution 15 mL (15 mLs Oral Given 07/15/21 1751)  pantoprazole (PROTONIX) injection 40 mg (40 mg Intravenous Given 07/15/21 1751)  iohexol (OMNIPAQUE) 300 MG/ML solution 100 mL (100 mLs Intravenous Contrast Given 07/15/21 1824)     IMPRESSION / MDM /  ASSESSMENT AND PLAN / ED COURSE  I reviewed the triage vital signs and the nursing notes.                              Ddx:  Differential includes the following, with pertinent life- or limb-threatening emergencies considered:  Gastritis, GERD, PUD, duodenitis, cholecystitis, colitis, obstruction, gastroparesis, pancreatitis  Patient's presentation is most consistent with acute illness / injury with system symptoms.  MDM:  46 yo F with h/o gastric bypass here with ongoing abdominal pain, bloating, and gnawing epigastric discomfort. Pt reportedly H. Pylori +, was sent in for CT after KUB shows possible ileus. Labs overall very reassuring. CBC shows no leukocytosis or anemia. LFTs normal. Renal function normal. Lipase normal. UA without signs of UTI. CT scan obtained, reviewed, shows no perforation, obstruction, or other emergent pathology. Sx do  seem c/w likely H. Pylori related gastritis/PUD. Will tx as such with outpt regimen and refer to GI. Will also give symptomatic tx. Return precautions given.    MEDICATIONS GIVEN IN ED: Medications  dicyclomine (BENTYL) capsule 20 mg (20 mg Oral Given 07/15/21 1751)  alum & mag hydroxide-simeth (MAALOX/MYLANTA) 200-200-20 MG/5ML suspension 30 mL (30 mLs Oral Given 07/15/21 1751)    And  lidocaine (XYLOCAINE) 2 % viscous mouth solution 15 mL (15 mLs Oral Given 07/15/21 1751)  pantoprazole (PROTONIX) injection 40 mg (40 mg Intravenous Given 07/15/21 1751)  iohexol (OMNIPAQUE) 300 MG/ML solution 100 mL (100 mLs Intravenous Contrast Given 07/15/21 1824)     Consults:  None   EMR reviewed       FINAL CLINICAL IMPRESSION(S) / ED DIAGNOSES   Final diagnoses:  H. pylori duodenitis  Generalized abdominal pain     Rx / DC Orders   ED Discharge Orders          Ordered    pantoprazole (PROTONIX) 40 MG tablet  2 times daily        07/15/21 1948    clarithromycin (BIAXIN) 500 MG tablet  2 times daily        07/15/21 1948    amoxicillin (AMOXIL) 500 MG capsule  2 times daily        07/15/21 1948    metroNIDAZOLE (FLAGYL) 500 MG tablet  2 times daily        07/15/21 1948    ondansetron (ZOFRAN-ODT) 4 MG disintegrating tablet  Every 8 hours PRN        07/15/21 1948    dicyclomine (BENTYL) 20 MG tablet  3 times daily PRN        07/15/21 1948             Note:  This document was prepared using Dragon voice recognition software and may include unintentional dictation errors.   Duffy Bruce, MD 07/15/21 2228

## 2022-08-07 ENCOUNTER — Other Ambulatory Visit: Payer: Self-pay

## 2022-08-07 ENCOUNTER — Emergency Department
Admission: EM | Admit: 2022-08-07 | Discharge: 2022-08-07 | Disposition: A | Discharge status: Home / Self Care | Payer: BC Managed Care – PPO | Attending: Emergency Medicine | Admitting: Emergency Medicine

## 2022-08-07 DIAGNOSIS — Y92410 Unspecified street and highway as the place of occurrence of the external cause: Secondary | ICD-10-CM | POA: Diagnosis not present

## 2022-08-07 DIAGNOSIS — S39012A Strain of muscle, fascia and tendon of lower back, initial encounter: Secondary | ICD-10-CM | POA: Diagnosis not present

## 2022-08-07 DIAGNOSIS — S161XXA Strain of muscle, fascia and tendon at neck level, initial encounter: Secondary | ICD-10-CM | POA: Diagnosis not present

## 2022-08-07 DIAGNOSIS — S199XXA Unspecified injury of neck, initial encounter: Secondary | ICD-10-CM | POA: Diagnosis present

## 2022-08-07 MED ORDER — NAPROXEN 500 MG PO TABS: 500.0000 mg | ORAL_TABLET | Freq: Two times a day (BID) | ORAL | 2 refills | Status: AC

## 2022-08-07 MED ORDER — METHOCARBAMOL 500 MG PO TABS: 500.0000 mg | ORAL_TABLET | Freq: Three times a day (TID) | ORAL | 1 refills | Status: AC | PRN
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
## 2023-01-28 IMAGING — CT CT ABD-PELV W/ CM
2 of 4 series · 15 of 46 positions shown, 17 images · IV contrast (APPLIED)
Comparison: None Available.

CLINICAL DATA: Abdominal pain.  Nonlocalized

EXAM:
CT ABDOMEN AND PELVIS WITH CONTRAST
TECHNIQUE: Multidetector CT imaging of the abdomen and pelvis was performed
using the standard protocol following bolus administration of
intravenous contrast.

[Series 2: routine abd/pel with · axial · 0.79mm/px · z∈[-411,+44]mm · 12 of 101 slices shown, 14 images]
[im 5/101  soft-tissue]
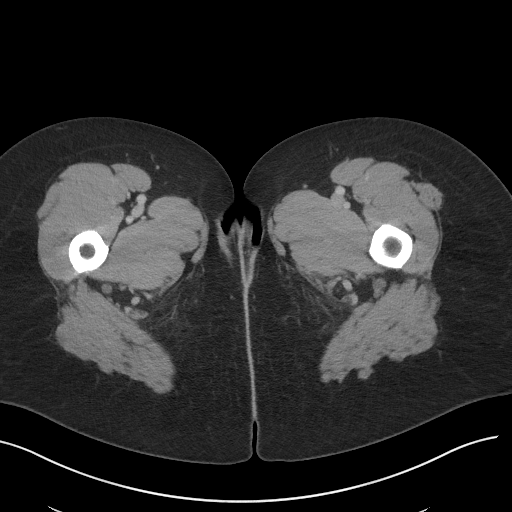
[im 5/101  bone]
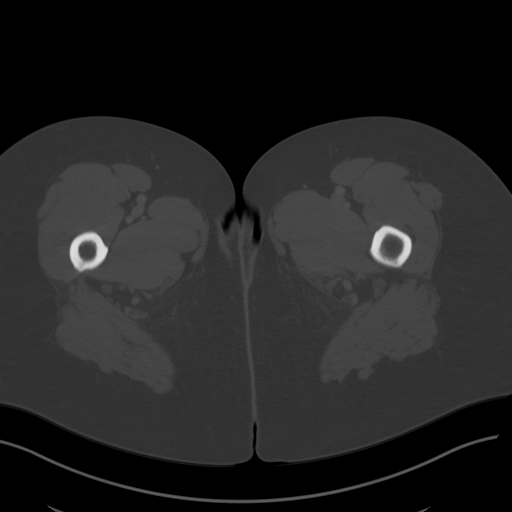
[im 14/101  soft-tissue]
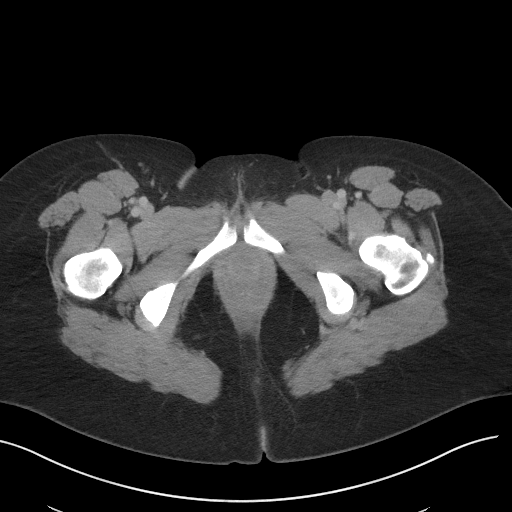
[im 22/101  soft-tissue]
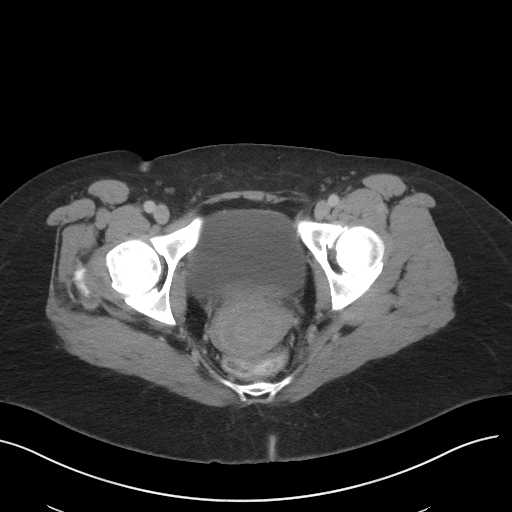
[im 31/101  soft-tissue]
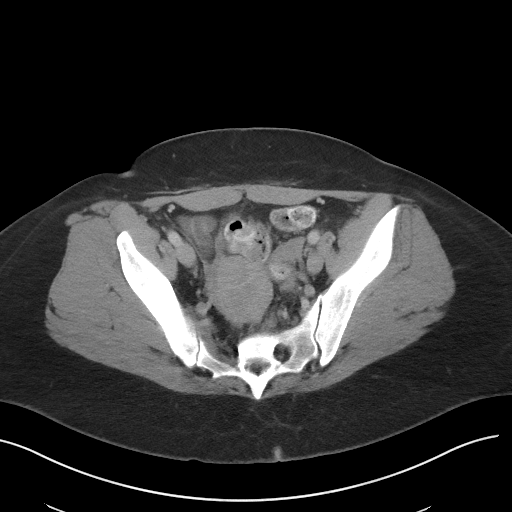
[im 40/101  soft-tissue]
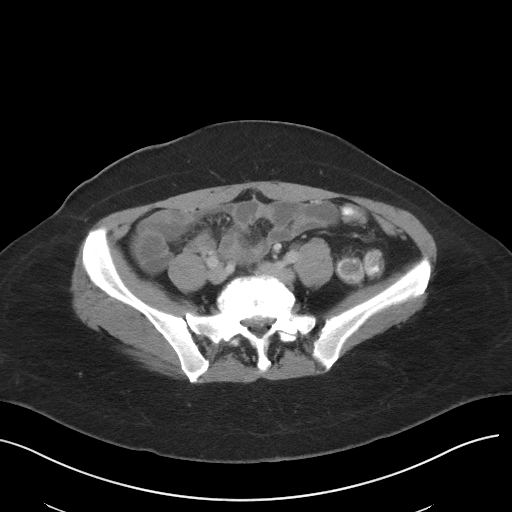
[im 48/101  soft-tissue]
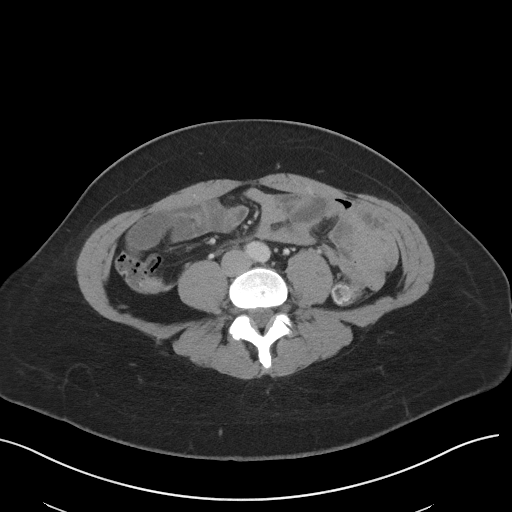
[im 53/101  soft-tissue]
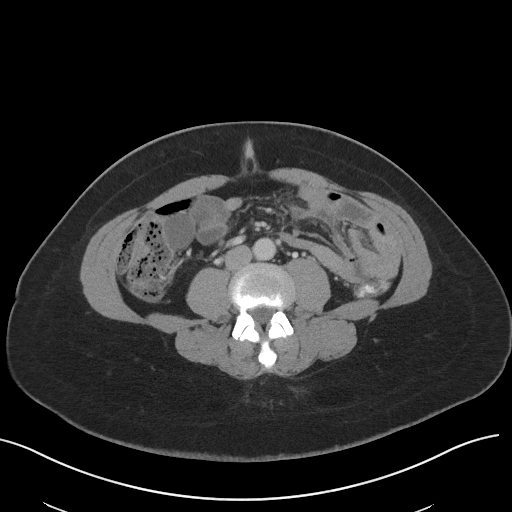
[im 61/101  soft-tissue]
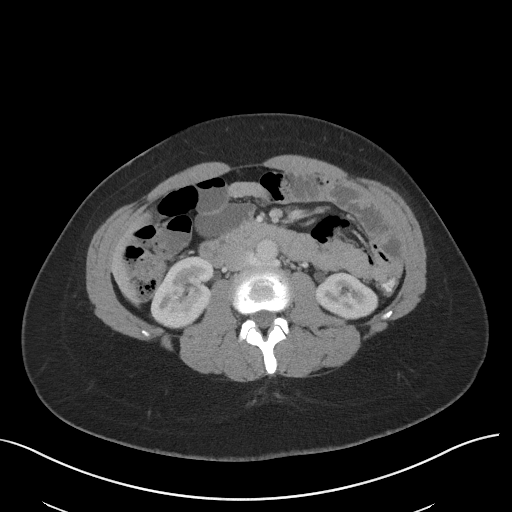
[im 70/101  soft-tissue]
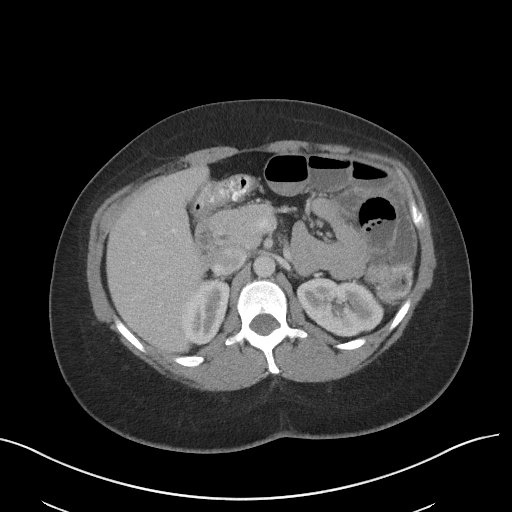
[im 70/101  bone]
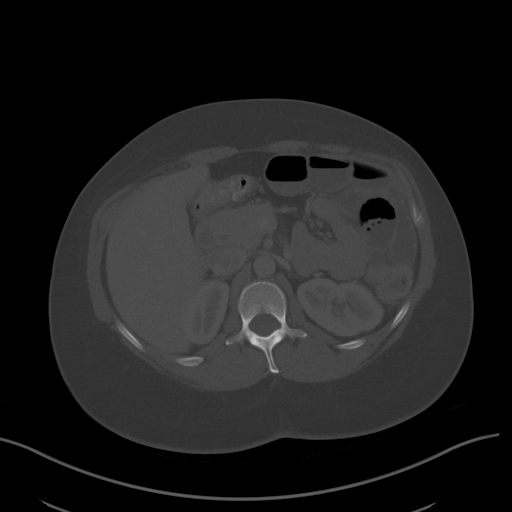
[im 79/101  soft-tissue]
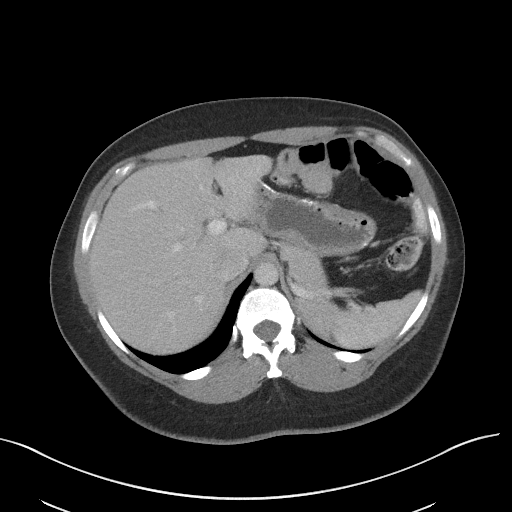
[im 87/101  soft-tissue]
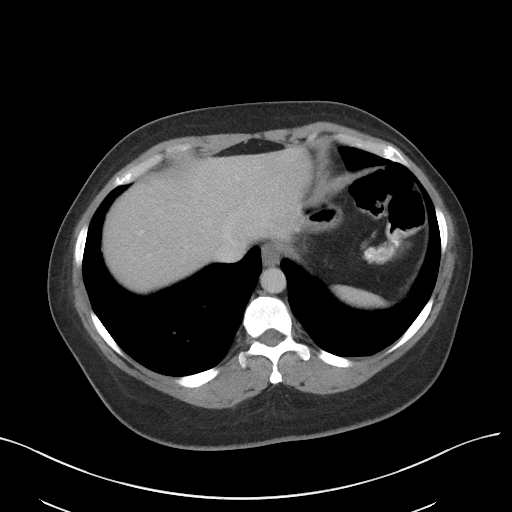
[im 96/101  soft-tissue]
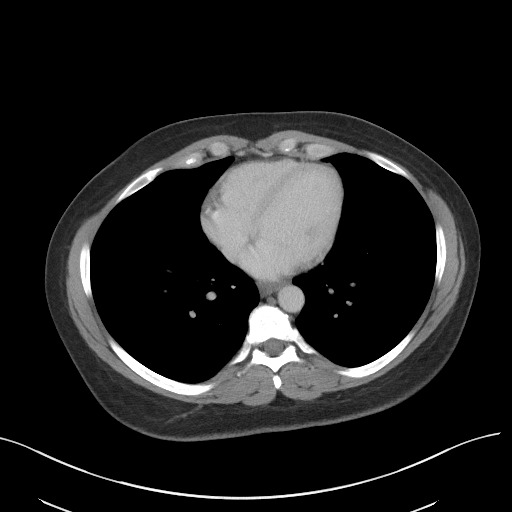

[Series 5: coronal st · coronal · 0.71mm/px · 3 of 85 slices shown]
[im 29/85  soft-tissue]
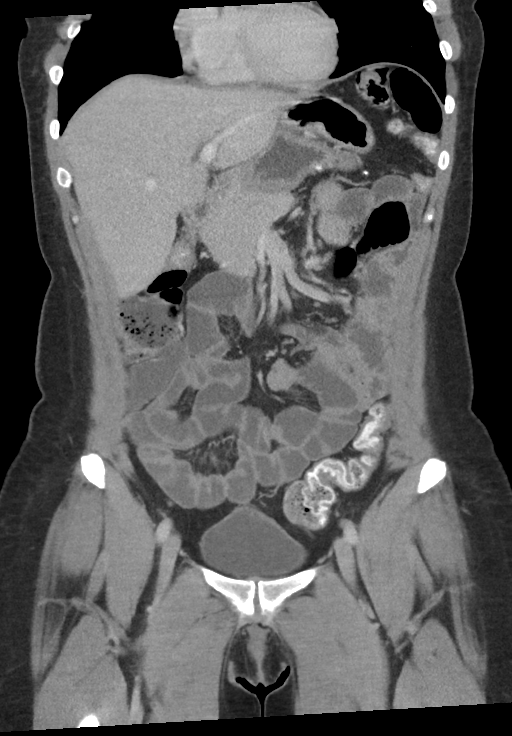
[im 38/85  soft-tissue]
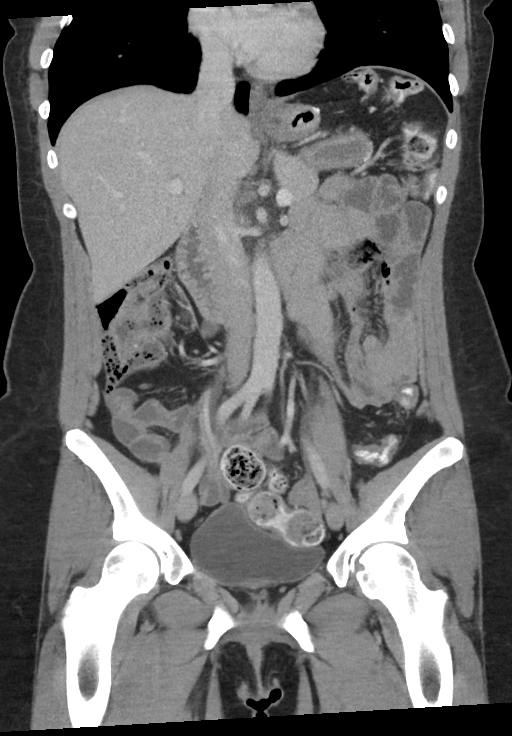
[im 47/85  soft-tissue]
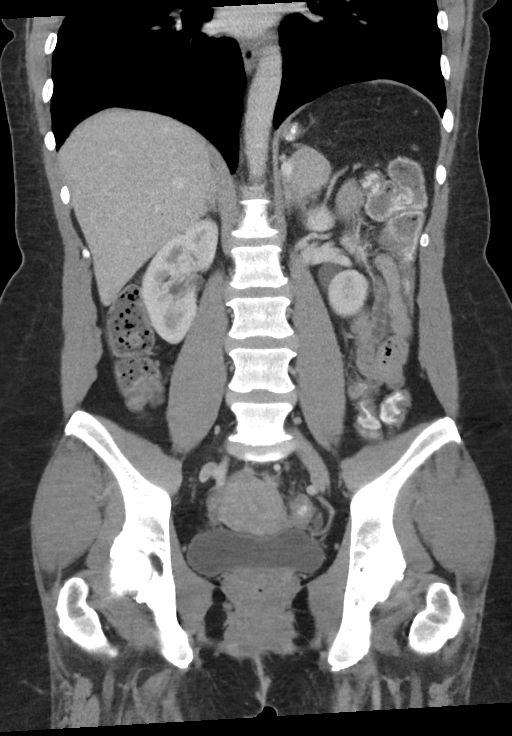

[15 of 46 positions shown; findings below may reference images not displayed]

RADIATION DOSE REDUCTION: This exam was performed according to the
departmental dose-optimization program which includes automated
exposure control, adjustment of the mA and/or kV according to
patient size and/or use of iterative reconstruction technique.

CONTRAST:  100mL OMNIPAQUE IOHEXOL 300 MG/ML  SOLN
FINDINGS: Lower chest: Lung bases are clear.

Hepatobiliary: No focal hepatic lesion. Postcholecystectomy. No
biliary dilatation.

Pancreas: Pancreas is normal. No ductal dilatation. No pancreatic
inflammation.

Spleen: Normal spleen

Adrenals/urinary tract: Adrenal glands normal

Single punctate calcification lower pole of the RIGHT kidney (image
44/2). No ureterolithiasis or obstructive uropathy. Bladder normal.

Stomach/Bowel: Post gastric sleeve bariatric surgery. No acute
findings in the stomach. Duodenum normal. Small bowel is
fluid-filled. No obstruction or inflammation identified. Appendix
normal. Cecum normal. The colon and rectosigmoid colon are normal.

Vascular/Lymphatic: Abdominal aorta is normal caliber. No periportal
or retroperitoneal adenopathy. No pelvic adenopathy.

Reproductive: Uterus and adnexa unremarkable.

Other: No free fluid.

Musculoskeletal: No aggressive osseous lesion.
IMPRESSION: 1. No acute findings in the abdomen pelvis.
2. Postcholecystectomy.
3. Post gastric sleeve bariatric surgery without complication.
4. Normal appendix.
5. Tiny nonobstructing RIGHT renal calculus
